# Patient Record
Sex: Female | Born: 1960 | Race: White | Hispanic: No | Marital: Married | Smoking: Former smoker
Health system: Southern US, Community
[De-identification: ages and names within clinical notes are randomized; demographics above are authoritative.]

## PROBLEM LIST (undated history)

## (undated) DIAGNOSIS — I10 Essential (primary) hypertension: Secondary | ICD-10-CM

## (undated) HISTORY — DX: Essential (primary) hypertension: I10

---

## 2004-02-06 ENCOUNTER — Emergency Department (HOSPITAL_COMMUNITY): Admission: EM | Admit: 2004-02-06 | Discharge: 2004-02-06 | Payer: Self-pay | Admitting: Emergency Medicine

## 2006-03-06 ENCOUNTER — Other Ambulatory Visit: Admission: RE | Admit: 2006-03-06 | Discharge: 2006-03-06 | Payer: Self-pay | Admitting: Gynecology

## 2006-03-13 ENCOUNTER — Encounter: Admission: RE | Admit: 2006-03-13 | Discharge: 2006-03-13 | Payer: Self-pay | Admitting: Gynecology

## 2006-12-20 ENCOUNTER — Inpatient Hospital Stay (HOSPITAL_COMMUNITY): Admission: AD | Admit: 2006-12-20 | Discharge: 2006-12-20 | Payer: Self-pay | Admitting: Obstetrics and Gynecology

## 2007-01-18 ENCOUNTER — Inpatient Hospital Stay (HOSPITAL_COMMUNITY): Admission: RE | Admit: 2007-01-18 | Discharge: 2007-01-21 | Payer: Self-pay | Admitting: Obstetrics and Gynecology

## 2007-01-18 ENCOUNTER — Encounter (INDEPENDENT_AMBULATORY_CARE_PROVIDER_SITE_OTHER): Payer: Self-pay | Admitting: Specialist

## 2007-04-10 ENCOUNTER — Encounter: Admission: RE | Admit: 2007-04-10 | Discharge: 2007-04-10 | Payer: Self-pay | Admitting: Obstetrics and Gynecology

## 2008-04-10 ENCOUNTER — Encounter: Admission: RE | Admit: 2008-04-10 | Discharge: 2008-04-10 | Payer: Self-pay | Admitting: Obstetrics and Gynecology

## 2011-03-18 NOTE — Op Note (Signed)
NAMEHIMANI, CORONA               ACCOUNT NO.:  [id number]   MEDICAL RECORD NO.:  [id number]          PATIENT TYPE:  INP   LOCATION:  NA                            FACILITY:  [facility]   PHYSICIAN:  Malva Limes, M.D.    DATE OF BIRTH:  1961-09-21   DATE OF PROCEDURE:  01/18/2007  DATE OF DISCHARGE:                               OPERATIVE REPORT   PREOPERATIVE DIAGNOSES:  1. Intrauterine pregnancy at term.  2. History of prior cesarean section.  3. The patient desires repeat cesarean section and bilateral tubal      ligation.   POSTOPERATIVE DIAGNOSES:  1. Intrauterine pregnancy at term.  2. History of prior cesarean section.  3. The patient desires repeat cesarean section and bilateral tubal      ligation.   PROCEDURE:  1. Repeat low transverse cesarean section.  2. Pomeroy bilateral tubal ligation.   SURGEON:  Dr. Dareen Piano   ASSISTANT:  Dr. Edward Jolly.   ANTIBIOTICS:  Ancef 1 g.   ANESTHESIA:  Spinal.   ESTIMATED BLOOD LOSS:  900 mL.   COMPLICATIONS:  None.   SPECIMENS:  None.   FINDINGS:  The patient delivered one viable white female infant.  Apgars 9  at one minute and 9 at five minutes.  The patient had normal placenta,  fallopian tubes, and ovaries bilaterally.   PROCEDURE:  The patient was taken to the operating room, where a spinal  anesthetic was administered without complications.  She was placed in  the dorsal supine position with a left lateral tilt.  The patient was  prepped with Betadine and draped in the usual fashion for this  procedure.  A Foley catheter was placed.  A Pfannenstiel incision was  made through the previous scar.  This was carried down to the fascia.  The fascia was entered in the midline and extended laterally with the  Mayo scissors.  The rectus muscles were then dissected from the fascia  with the Bovie.  Rectus muscles were divided in the midline and taken  superiorly and inferiorly.  The parietal peritoneum was opened sharply  and taken  superiorly and inferiorly.  The bladder flap was not taken  down.  A low transverse uterine incision was made in the midline and  extended laterally with blunt dissection.  Amniotic sac was entered.  The Fluid was noted to be clear.  The infant was delivered with the  vacuum extractor.  On delivery of the head, the oropharynx and nostrils  were bulb suctioned.  The remaining infant was then delivered.  There  was a nuchal cord x1.  The cord was doubly clamped and cut, and the  infant handed to the awaiting NICU team.  The placenta was manually  removed.  The uterus was exteriorized.  The uterine cavity was cleaned  with a wet lap and inspected.  The uterine incision was closed in a  single layer of 0 Monocryl suture in a running locking fashion.  The  right fallopian tube was then grasped in the isthmic portion with the  Babcock.  A 2 cm portion was doubly  ligated with 0 gut suture x2.  The  knuckle was excised.  Both ostia were visualized.  Hemostasis appeared  to be adequate.  Similar procedure was performed on the opposite side.  The uterus was placed back into the abdominal cavity.  Hemostasis again  checked and found to be adequate.  At this point, the parietal  peritoneum and rectus muscles were reapproximated in the midline using 2-  0 Monocryl in a running fashion.  The fascia was closed using 0 Monocryl  suture in a running fashion.  Subcuticular tissue was made hemostatic  with the Bovie.  Pfannenstiel clips were used to close the skin.  The  patient tolerated the procedure well.  She was taken to the recovery  room in stable condition.  Instrument and lap counts were correct x2.           ______________________________  Malva Limes, M.D.     MA/MEDQ  D:  01/18/2007  T:  01/18/2007  Job:  [id number]

## 2011-03-18 NOTE — Discharge Summary (Signed)
Amanda Reynolds, Amanda Reynolds               ACCOUNT NO.:  [id number]   MEDICAL RECORD NO.:  [id number]          PATIENT TYPE:  INP   LOCATION:  9142                          FACILITY:  [facility]   PHYSICIAN:  Randye Lobo, M.D.   DATE OF BIRTH:  03-14-1961   DATE OF ADMISSION:  01/18/2007  DATE OF DISCHARGE:  01/21/2007                               DISCHARGE SUMMARY   FINAL DIAGNOSES:  1. Intrauterine pregnancy at term.  2. History of prior cesarean section.  The patient desires repeat      cesarean section.  3. Patient desires permanent sterilization.  4. Mitral valve prolapse   PROCEDURE:  Repeat low transverse cesarean section and Pomeroy a  bilateral tubal ligation.   SURGEON:  Dr. Dareen Piano.   ASSISTANT:  Dr. Conley Simmonds.   COMPLICATIONS:  None.   This 50 year old, G2 P 1-0-0-1, presents at term for repeat cesarean  section.  The patient's antepartum course up to this point had been  complicated by advanced maternal age.  The patient was 45 at the time of  conception.  The patient had a first-trimester screen performed which  came back normal.  She declined AFP and amniocentesis.  Otherwise the  patient's antepartum course had been uncomplicated.  She had a marginal  placenta previa that resolved by 28 weeks.  She is admitted on January 18, 2007 where she is taken to the operating room by Dr. Malva Limes.  A  repeat low transverse cesarean section was performed with delivery of an  8 pound 4 ounces female infant with Apgars of 9 and 9.  At this point the  patient still expressed her desires for permanent sterilization.  This  was performed using the Pomeroy method.  The procedure went without  complications.   The patient's postoperative course was benign without any significant  fevers.  She did have some mild postoperative anemia, and she was  started on iron during her hospital stay.  She did have her little boy  circumcised before discharge.  She was felt ready for discharge  on  postoperative day #3.  Was sent home on a regular diet, told to decrease  activities, told to continue her prenatal vitamins and her iron  supplement.  She was given Percocet one to two every 4-6 hours as needed  for pain, ibuprofen up to 600 mg every 6 hours as needed for pain, and  is to follow up  in our office in 4 weeks.  The patient was told to continue a bland diet  until her bowel function returned to normal.  She is of course to call  with any increased bleeding, pain or problems.   LABS ON DISCHARGE:  The patient had a hemoglobin of 9.6, white blood  cell count of 8.9, platelets of 155,000.      Amanda Reynolds, Amanda Reynolds.      Randye Lobo, M.D.  Electronically Signed    MB/MEDQ  D:  02/21/2007  T:  02/21/2007  Job:  [id number]

## 2011-11-15 ENCOUNTER — Encounter (HOSPITAL_COMMUNITY): Payer: Self-pay

## 2011-11-15 ENCOUNTER — Observation Stay (HOSPITAL_COMMUNITY)
Admission: EM | Admit: 2011-11-15 | Discharge: 2011-11-16 | Disposition: A | Discharge status: Home / Self Care | Payer: 59 | Attending: Emergency Medicine | Admitting: Emergency Medicine

## 2011-11-15 ENCOUNTER — Emergency Department (HOSPITAL_COMMUNITY): Payer: 59

## 2011-11-15 ENCOUNTER — Other Ambulatory Visit: Payer: Self-pay

## 2011-11-15 DIAGNOSIS — R05 Cough: Secondary | ICD-10-CM | POA: Insufficient documentation

## 2011-11-15 DIAGNOSIS — R110 Nausea: Secondary | ICD-10-CM | POA: Insufficient documentation

## 2011-11-15 DIAGNOSIS — R059 Cough, unspecified: Secondary | ICD-10-CM | POA: Insufficient documentation

## 2011-11-15 DIAGNOSIS — R079 Chest pain, unspecified: Principal | ICD-10-CM | POA: Insufficient documentation

## 2011-11-15 LAB — CBC
HCT: 38.2 % (ref 36.0–46.0)
Hemoglobin: 12.8 g/dL (ref 12.0–15.0)
MCH: 26.1 pg (ref 26.0–34.0)
MCHC: 33.5 g/dL (ref 30.0–36.0)
MCV: 77.8 fL — ABNORMAL LOW (ref 78.0–100.0)
Platelets: 306 10*3/uL (ref 150–400)
RBC: 4.91 MIL/uL (ref 3.87–5.11)
RDW: 14.3 % (ref 11.5–15.5)
WBC: 11.9 10*3/uL — ABNORMAL HIGH (ref 4.0–10.5)

## 2011-11-15 LAB — CARDIAC PANEL(CRET KIN+CKTOT+MB+TROPI)
CK, MB: 1.7 ng/mL (ref 0.3–4.0)
Relative Index: INVALID (ref 0.0–2.5)
Total CK: 73 U/L (ref 7–177)
Troponin I: <0.3 ng/mL (ref <0.30)

## 2011-11-15 LAB — COMPREHENSIVE METABOLIC PANEL
ALT: 11 U/L (ref 0–35)
AST: 12 U/L (ref 0–37)
Albumin: 3.4 g/dL — ABNORMAL LOW (ref 3.5–5.2)
Alkaline Phosphatase: 84 U/L (ref 39–117)
BUN: 10 mg/dL (ref 6–23)
CO2: 22 mEq/L (ref 19–32)
Calcium: 9.3 mg/dL (ref 8.4–10.5)
Chloride: 103 mEq/L (ref 96–112)
Creatinine, Ser: 0.56 mg/dL (ref 0.50–1.10)
GFR calc Af Amer: >90 mL/min (ref >90)
GFR calc non Af Amer: >90 mL/min (ref >90)
Glucose, Bld: 93 mg/dL (ref 70–99)
Potassium: 3.7 mEq/L (ref 3.5–5.1)
Sodium: 138 mEq/L (ref 135–145)
Total Bilirubin: 0.2 mg/dL — ABNORMAL LOW (ref 0.3–1.2)
Total Protein: 7.5 g/dL (ref 6.0–8.3)

## 2011-11-15 LAB — DIFFERENTIAL
Basophils Absolute: 0 10*3/uL (ref 0.0–0.1)
Basophils Relative: 0 % (ref 0–1)
Eosinophils Absolute: 0.1 10*3/uL (ref 0.0–0.7)
Eosinophils Relative: 1 % (ref 0–5)
Lymphocytes Relative: 25 % (ref 12–46)
Lymphs Abs: 3 10*3/uL (ref 0.7–4.0)
Monocytes Absolute: 0.8 10*3/uL (ref 0.1–1.0)
Monocytes Relative: 6 % (ref 3–12)
Neutro Abs: 8.1 10*3/uL — ABNORMAL HIGH (ref 1.7–7.7)
Neutrophils Relative %: 68 % (ref 43–77)

## 2011-11-15 LAB — TROPONIN I
Troponin I: <0.3 ng/mL (ref <0.30)
Troponin I: <0.3 ng/mL (ref <0.30)

## 2011-11-15 MED ORDER — MORPHINE SULFATE 4 MG/ML IJ SOLN
4.0000 mg | Freq: Once | INTRAMUSCULAR | Status: AC
Start: 2011-11-15 — End: 2011-11-15
  Administered 2011-11-15: 4 mg via INTRAVENOUS
  Filled 2011-11-15: qty 1

## 2011-11-15 MED ORDER — ONDANSETRON HCL 4 MG/2ML IJ SOLN
4.0000 mg | Freq: Once | INTRAMUSCULAR | Status: AC
  Administered 2011-11-15: 4 mg via INTRAVENOUS
  Filled 2011-11-15: qty 2

## 2011-11-15 MED ORDER — NITROGLYCERIN 2 % TD OINT: 1.0000 [in_us] | TOPICAL_OINTMENT | Freq: Once | TRANSDERMAL | Status: DC

## 2011-11-15 MED ORDER — ONDANSETRON HCL 4 MG/2ML IJ SOLN
4.0000 mg | Freq: Once | INTRAMUSCULAR | Status: AC
Start: 2011-11-15 — End: 2011-11-15
  Administered 2011-11-15: 4 mg via INTRAVENOUS
  Filled 2011-11-15: qty 2

## 2011-11-15 MED ORDER — NITROGLYCERIN 2 % TD OINT
1.0000 [in_us] | TOPICAL_OINTMENT | TRANSDERMAL | Status: DC
  Filled 2011-11-15: qty 30

## 2011-11-15 NOTE — ED Notes (Signed)
Patient transported from X-ray 

## 2011-11-15 NOTE — ED Notes (Addendum)
PATIENT HAS ARRIVED IN CDU. SHE IS ALERT AND ORIENTED. SHE DENIES CP AT THIS TIME. PATIENT PLACED ON MONITOR. SINUS RHYTHM

## 2011-11-15 NOTE — ED Provider Notes (Signed)
History     CSN: [id number]  Arrival date & time 11/15/11  1538   First MD Initiated Contact with Patient 11/15/11 1546      Chief Complaint  Patient presents with  . Chest Pain    (Consider location/radiation/quality/duration/timing/severity/associated sxs/prior treatment) HPI  51yoF is a healthy presents with chest pain. The patient states that approximately 2 hours prior to arrival she began to feel sharp precordial chest pain with radiation to her left arm. She states that her left arm aches. She denies numbness, tingling, weakness of the same. She states that she has nausea but the nausea has been present for approximately one week. Is no radiation of the pain to her back. She has no shortness of breath. She's had cough for approximately one week. Denies fevers, chills. She states that she's had similar pain in the past thought to be related to anxiety, stress. She's been seen by cardiology approximately one year ago. She's had no stress test or catheterization recently. She states that at maximum the pain was a 10 out of 10 it is currently 2/10 after aspirin and one nitroglycerin given by EMS. He has a history of hypertension, no hyperlipidemia. She is a former smoker. Denies h/o VTE in self or family. No recent hosp/surg/immob. No h/o cancer. Denies exogenous hormone use, no leg pain or swelling   ED Notes, ED Provider Notes from 11/15/11 0000 to 11/15/11 15:22:53       Raynelle Fanning E. Chana Bode, RN 11/15/2011 15:21      Per ems- pt c/o central chest pain and left arm pain (no radiation). Pt has had similar pain intermittently for over 1 year and pt states that she believes it is stress related. Pt also c/o left arm pain from shoulder to arm. Pt states she has been nauseous but has been going on x1wk. 20 left hand. 324 asa. 1 nitro. Pain currently 2/10.     History reviewed. No pertinent past medical history.  History reviewed. No pertinent past surgical history.  Family History  Problem  Relation Age of Onset  . Diabetes Mother   . Hypertension Mother    artery disease in her father with ami age 82  History  Substance Use Topics  . Smoking status: Never Smoker   . Smokeless tobacco: Not on file  . Alcohol Use: Yes     occ    OB History    Grav Para Term Preterm Abortions TAB SAB Ect Mult Living                  Review of Systems  All other systems reviewed and are negative.   except as noted HPI   Allergies  Review of patient's allergies indicates no known allergies.  Home Medications   Current Outpatient Rx  Name Route Sig Dispense Refill  . CALCIUM + D PO Oral Take 1 tablet by mouth 2 (two) times daily.    Marland Kitchen VITAMIN D2 2000 UNITS PO TABS Oral Take 1 tablet by mouth daily.    . OMEGA-3 FATTY ACIDS 1000 MG PO CAPS Oral Take 2 g by mouth daily.    . IBUPROFEN 200 MG PO TABS Oral Take 400-600 mg by mouth every 6 (six) hours as needed. For pain    . ADULT MULTIVITAMIN W/MINERALS CH Oral Take 1 tablet by mouth daily.    . SODIUM CHLORIDE 0.65 % NA SOLN Nasal Place 1 spray into the nose as needed. For nasal congestion  BP 129/62  Pulse 80  Temp(Src) 98.6 F (37 C) (Oral)  Resp 15  SpO2 100%  Physical Exam  Nursing note and vitals reviewed. Constitutional: She is oriented to person, place, and time. She appears well-developed.  HENT:  Head: Atraumatic.  Mouth/Throat: Oropharynx is clear and moist.  Eyes: Conjunctivae and EOM are normal. Pupils are equal, round, and reactive to light.  Neck: Normal range of motion. Neck supple.  Cardiovascular: Normal rate, regular rhythm, normal heart sounds and intact distal pulses.   Pulmonary/Chest: Effort normal and breath sounds normal. No respiratory distress. She has no wheezes. She has no rales. She exhibits no tenderness.  Abdominal: Soft. She exhibits no distension. There is no tenderness. There is no rebound and no guarding.  Musculoskeletal: Normal range of motion.  Neurological: She is alert and  oriented to person, place, and time.  Skin: Skin is warm and dry. No rash noted.  Psychiatric: She has a normal mood and affect.    Date: 11/15/2011  Rate: 75  Rhythm: normal sinus rhythm  QRS Axis: normal  Intervals: normal  ST/T Wave abnormalities: normal  Conduction Disutrbances:none  Narrative Interpretation:   Old EKG Reviewed: none available    ED Course  Procedures (including critical care time)  Labs Reviewed  CBC - Abnormal; Notable for the following:    WBC 11.9 (*)    MCV 77.8 (*)    All other components within normal limits  DIFFERENTIAL - Abnormal; Notable for the following:    Neutro Abs 8.1 (*)    All other components within normal limits  COMPREHENSIVE METABOLIC PANEL - Abnormal; Notable for the following:    Albumin 3.4 (*)    Total Bilirubin 0.2 (*)    All other components within normal limits  CARDIAC PANEL(CRET KIN+CKTOT+MB+TROPI)  TROPONIN I   Dg Chest 2 View  11/15/2011  *RADIOLOGY REPORT*  Clinical Data: Sharp mid chest pain, left arm pain, some nausea  CHEST - 2 VIEW  Comparison: Chest x-ray of 12/29/2010  Findings: The lungs are clear.  Mediastinal contours appear normal. The heart is within normal limits in size.  There are degenerative changes throughout the thoracic spine.  IMPRESSION: No active lung disease.  Original Report Authenticated By: Juline Patch, M.D.     1. Chest pain       MDM  Atypical chest pain. TIMI 0. PERC 0. Will check cardiac enzymes. Will need at least two sets. EKG unremarkable. XR, morphine, nitroglycerin. ASA given pta.  Reassess.  1832 Pt without cp at this time. Will place in CDU under cp protocol for repeat cardiac enzymes and stress test in the morning.           Forbes Cellar, MD 11/15/11 [phone]

## 2011-11-15 NOTE — ED Notes (Signed)
Pt aware that she will be going to CDU.  Pt remains CP free at this time.  Pt denies any needs at this time.

## 2011-11-15 NOTE — ED Notes (Signed)
Pt resting in stretcher with family at bedside.  Pt denies needs at this time.

## 2011-11-15 NOTE — ED Notes (Signed)
Per ems- pt c/o central chest pain and left arm pain (no radiation). Pt has had similar pain intermittently for over 1 year and pt states that she believes it is stress related. Pt also c/o left arm pain from shoulder to arm. Pt states she has been nauseous but has been going on x1wk. 20 left hand. 324 asa. 1 nitro. Pain currently 2/10.

## 2011-11-15 NOTE — ED Notes (Signed)
Requested nitro paste from pharmacy

## 2011-11-16 ENCOUNTER — Encounter (HOSPITAL_COMMUNITY): Payer: Self-pay | Admitting: *Deleted

## 2011-11-16 ENCOUNTER — Other Ambulatory Visit: Payer: Self-pay

## 2011-11-16 DIAGNOSIS — R072 Precordial pain: Secondary | ICD-10-CM

## 2011-11-16 NOTE — ED Provider Notes (Signed)
Medical screening examination/treatment/procedure(s) were performed by non-physician practitioner and as supervising physician I was immediately available for consultation/collaboration.   Leigh-Ann Neiko Trivedi, MD 11/16/11 2230 

## 2011-11-16 NOTE — ED Provider Notes (Signed)
Patient in CDU under chest pain protocol.  Patient scheduled for stress echo in AM.  Patient currently chest pain free, but does complain of nausea.  Lungs CTA bilaterally.  S1/S2, RRR, no murmur.  Abdomen soft, bowel sounds present.  Strong distal pulses palpated all extremities.  Monitor reveals sinus rhythm without ectopy.  12 lead reviewed, no indication of ischemia.  Cardiac markers negative.  Patient scheduled for stress echo in AM.  Jimmye Norman, NP 11/16/11 [phone]

## 2011-11-16 NOTE — ED Provider Notes (Signed)
8:21 AM Patient is in CDU under observation, chest pain protocol.  Patient denies any chest pain at this time, reports nausea has also resolved.  Reports no problems overnight.  On exam, pt is A&Ox4, NAD, RRR, no m/r/g, CTAB, abd soft, NT, extremities without edema, distal pulses intact and equal bilaterally.  Plan is for exercise stress test this morning.    11:40 AM Received a call from Dr Eden Emms.  Stress echo is normal.  Patient continues to feel well.  PCP is [facility] on [address], which is closing down this month.  Patient has health insurance and would like to use her friends' recommendations to find a primary care provider.  I have advised close follow up.  Plan is for d/c home.    Amanda Reynolds, [place] 11/16/11 1147

## 2011-11-16 NOTE — ED Notes (Signed)
Pt returned from echocardiogram. Pt denies any pain, sob or discomfort. Pt assisted back to bed and placed back on cardiac monitor.

## 2011-11-16 NOTE — ED Provider Notes (Signed)
Medical screening examination/treatment/procedure(s) were performed by non-physician practitioner and as supervising physician I was immediately available for consultation/collaboration.   Forbes Cellar, MD 11/16/11 2230

## 2011-11-16 NOTE — Progress Notes (Signed)
Observation review is complete. 

## 2011-11-16 NOTE — Progress Notes (Signed)
  Echocardiogram Echocardiogram Stress Test has been performed.  Ramzey Petrovic, Real Cons 11/16/2011, 10:52 AM

## 2011-12-30 ENCOUNTER — Encounter: Payer: Self-pay | Admitting: Family Medicine

## 2011-12-30 ENCOUNTER — Ambulatory Visit (INDEPENDENT_AMBULATORY_CARE_PROVIDER_SITE_OTHER): Payer: 59 | Admitting: Family Medicine

## 2011-12-30 VITALS — BP 132/79 | HR 77 | Temp 97.4°F | Resp 20 | Ht 59.5 in | Wt 185.2 lb

## 2011-12-30 DIAGNOSIS — Z23 Encounter for immunization: Secondary | ICD-10-CM

## 2011-12-30 DIAGNOSIS — Z0000 Encounter for general adult medical examination without abnormal findings: Secondary | ICD-10-CM

## 2011-12-30 NOTE — Progress Notes (Signed)
  Subjective:    Patient ID: Amanda Reynolds, female    DOB: Jun 30, 1961, 51 y.o.   MRN: [id number]  HPI Amanda Reynolds is a 51 y.o. female  Working on weight loss - diet and walking more.   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 Fasting today.   Taking flax seed. Hx of elevated cholesterol in family. Review of Systems  13 point ROS reviewed on PHS.  - see pink sheet scanned.   Admitted to [facility] in January - had chest pain in meeting, but notes chest, had normal enzymes, EKG, and normal stress test in hospital.  Has seen cards in past.   Father deceased at 5yo - possible MI.     Objective:   Physical Exam  Constitutional: She is oriented to person, place, and time. She appears well-developed and well-nourished. No distress.  HENT:  Head: Normocephalic and atraumatic.  Right Ear: External ear normal.  Left Ear: External ear normal.  Eyes: Conjunctivae and EOM are normal. Pupils are equal, round, and reactive to light.  Neck: Normal range of motion. Neck supple.  Cardiovascular: Normal rate, regular rhythm, normal heart sounds and intact distal pulses.   No murmur heard. Pulmonary/Chest: Effort normal and breath sounds normal.  Abdominal: Soft. Bowel sounds are normal.  Musculoskeletal: Normal range of motion. She exhibits no edema and no tenderness.  Lymphadenopathy:    She has no cervical adenopathy.  Neurological: She is alert and oriented to person, place, and time.  Skin: Skin is warm and dry.  Psychiatric: She has a normal mood and affect. Her behavior is normal.          Assessment & Plan:  Amanda Reynolds is a 51 y.o. female  CPE - anticipatory guidance given.  Check CMP, lipids, CBC.  Tdap given. Plans on mammogram and pap smear at GYN.  Chest pain - s/p workup at [facility].Possible FH of CAD, but recent workup ok.  Consider follow up with cards in next 1 year, sooner or to ER if recurrence of symptoms - pt to call Dr. Katrinka Blazing to determine follow up.

## 2011-12-31 LAB — COMPREHENSIVE METABOLIC PANEL
ALT: 13 U/L (ref 0–35)
AST: 17 U/L (ref 0–37)
Albumin: 4.5 g/dL (ref 3.5–5.2)
Alkaline Phosphatase: 86 U/L (ref 39–117)
BUN: 10 mg/dL (ref 6–23)
CO2: 17 mEq/L — ABNORMAL LOW (ref 19–32)
Calcium: 9.3 mg/dL (ref 8.4–10.5)
Chloride: 104 mEq/L (ref 96–112)
Creat: 0.57 mg/dL (ref 0.50–1.10)
Glucose, Bld: 90 mg/dL (ref 70–99)
Potassium: 3.9 mEq/L (ref 3.5–5.3)
Sodium: 137 mEq/L (ref 135–145)
Total Bilirubin: 0.3 mg/dL (ref 0.3–1.2)
Total Protein: 7.2 g/dL (ref 6.0–8.3)

## 2011-12-31 LAB — CBC

## 2011-12-31 LAB — LIPID PANEL
Cholesterol: 207 mg/dL — ABNORMAL HIGH (ref 0–200)
HDL: 43 mg/dL (ref >39)
LDL Cholesterol: 144 mg/dL — ABNORMAL HIGH (ref 0–99)
Total CHOL/HDL Ratio: 4.8 Ratio
Triglycerides: 99 mg/dL (ref <150)
VLDL: 20 mg/dL (ref 0–40)

## 2012-01-11 ENCOUNTER — Telehealth: Payer: Self-pay

## 2012-01-11 NOTE — Telephone Encounter (Signed)
Pt is calling for her sugar and cholesterol numbers  Please call her at work

## 2012-01-11 NOTE — Telephone Encounter (Signed)
Spoke with patient gave patient results of labs

## 2012-04-30 ENCOUNTER — Other Ambulatory Visit: Payer: Self-pay | Admitting: Obstetrics and Gynecology

## 2013-06-14 ENCOUNTER — Other Ambulatory Visit: Payer: Self-pay | Admitting: Gastroenterology

## 2018-05-08 ENCOUNTER — Emergency Department (HOSPITAL_COMMUNITY): Payer: Self-pay

## 2018-05-08 ENCOUNTER — Encounter (HOSPITAL_COMMUNITY): Payer: Self-pay | Admitting: Internal Medicine

## 2018-05-08 ENCOUNTER — Emergency Department (HOSPITAL_COMMUNITY)
Admission: EM | Admit: 2018-05-08 | Discharge: 2018-05-08 | Disposition: A | Discharge status: Home / Self Care | Payer: Self-pay | Attending: Emergency Medicine | Admitting: Emergency Medicine

## 2018-05-08 DIAGNOSIS — H9201 Otalgia, right ear: Secondary | ICD-10-CM | POA: Insufficient documentation

## 2018-05-08 DIAGNOSIS — R079 Chest pain, unspecified: Secondary | ICD-10-CM | POA: Insufficient documentation

## 2018-05-08 DIAGNOSIS — R197 Diarrhea, unspecified: Secondary | ICD-10-CM | POA: Insufficient documentation

## 2018-05-08 DIAGNOSIS — R1030 Lower abdominal pain, unspecified: Secondary | ICD-10-CM | POA: Insufficient documentation

## 2018-05-08 DIAGNOSIS — I10 Essential (primary) hypertension: Secondary | ICD-10-CM | POA: Insufficient documentation

## 2018-05-08 DIAGNOSIS — R42 Dizziness and giddiness: Secondary | ICD-10-CM | POA: Insufficient documentation

## 2018-05-08 DIAGNOSIS — R5383 Other fatigue: Secondary | ICD-10-CM | POA: Insufficient documentation

## 2018-05-08 DIAGNOSIS — Z87891 Personal history of nicotine dependence: Secondary | ICD-10-CM | POA: Insufficient documentation

## 2018-05-08 DIAGNOSIS — R110 Nausea: Secondary | ICD-10-CM | POA: Insufficient documentation

## 2018-05-08 LAB — HEPATIC FUNCTION PANEL
ALT: 15 U/L (ref 0–44)
AST: 19 U/L (ref 15–41)
Albumin: 3.4 g/dL — ABNORMAL LOW (ref 3.5–5.0)
Alkaline Phosphatase: 79 U/L (ref 38–126)
Bilirubin, Direct: 0.2 mg/dL (ref 0.0–0.2)
Indirect Bilirubin: 0.3 mg/dL (ref 0.3–0.9)
Total Bilirubin: 0.5 mg/dL (ref 0.3–1.2)
Total Protein: 7.1 g/dL (ref 6.5–8.1)

## 2018-05-08 LAB — URINALYSIS, ROUTINE W REFLEX MICROSCOPIC
Bilirubin Urine: NEGATIVE
Glucose, UA: NEGATIVE mg/dL
Ketones, ur: NEGATIVE mg/dL
Leukocytes, UA: NEGATIVE
Nitrite: NEGATIVE
Protein, ur: NEGATIVE mg/dL
Specific Gravity, Urine: 1.015 (ref 1.005–1.030)
pH: 7 (ref 5.0–8.0)

## 2018-05-08 LAB — BASIC METABOLIC PANEL
Anion gap: 11 (ref 5–15)
BUN: 9 mg/dL (ref 6–20)
CO2: 23 mmol/L (ref 22–32)
Calcium: 9.1 mg/dL (ref 8.9–10.3)
Chloride: 105 mmol/L (ref 98–111)
Creatinine, Ser: 0.79 mg/dL (ref 0.44–1.00)
GFR calc Af Amer: >60 mL/min (ref >60)
GFR calc non Af Amer: >60 mL/min (ref >60)
Glucose, Bld: 115 mg/dL — ABNORMAL HIGH (ref 70–99)
Potassium: 4.5 mmol/L (ref 3.5–5.1)
Sodium: 139 mmol/L (ref 135–145)

## 2018-05-08 LAB — CBC
HCT: 41.1 % (ref 36.0–46.0)
Hemoglobin: 12.9 g/dL (ref 12.0–15.0)
MCH: 25.6 pg — ABNORMAL LOW (ref 26.0–34.0)
MCHC: 31.4 g/dL (ref 30.0–36.0)
MCV: 81.7 fL (ref 78.0–100.0)
Platelets: 346 10*3/uL (ref 150–400)
RBC: 5.03 MIL/uL (ref 3.87–5.11)
RDW: 15.1 % (ref 11.5–15.5)
WBC: 11.9 10*3/uL — ABNORMAL HIGH (ref 4.0–10.5)

## 2018-05-08 LAB — I-STAT TROPONIN, ED
Troponin i, poc: 0 ng/mL (ref 0.00–0.08)
Troponin i, poc: 0 ng/mL (ref 0.00–0.08)

## 2018-05-08 LAB — LIPASE, BLOOD: Lipase: 35 U/L (ref 11–51)

## 2018-05-08 LAB — CBG MONITORING, ED: Glucose-Capillary: 94 mg/dL (ref 70–99)

## 2018-05-08 LAB — I-STAT BETA HCG BLOOD, ED (MC, WL, AP ONLY): I-stat hCG, quantitative: <5 m[IU]/mL (ref <5)

## 2018-05-08 MED ORDER — MECLIZINE HCL 25 MG PO TABS
25.0000 mg | ORAL_TABLET | Freq: Once | ORAL | Status: AC
  Administered 2018-05-08: 25 mg via ORAL
  Filled 2018-05-08: qty 1

## 2018-05-08 MED ORDER — ONDANSETRON HCL 4 MG/2ML IJ SOLN
4.0000 mg | Freq: Once | INTRAMUSCULAR | Status: AC
  Administered 2018-05-08: 4 mg via INTRAVENOUS
  Filled 2018-05-08: qty 2

## 2018-05-08 MED ORDER — SODIUM CHLORIDE 0.9 % IV BOLUS
1000.0000 mL | Freq: Once | INTRAVENOUS | Status: AC
  Administered 2018-05-08: 1000 mL via INTRAVENOUS

## 2018-05-08 MED ORDER — MECLIZINE HCL 25 MG PO TABS: 25.0000 mg | ORAL_TABLET | Freq: Three times a day (TID) | ORAL | 0 refills | Status: DC | PRN

## 2018-05-08 NOTE — Discharge Instructions (Signed)
See your family doctor within 2 to 3 days if still having mild symptoms but come back to the ER for worsening symptoms.  Your MRI was normal

## 2018-05-08 NOTE — ED Notes (Signed)
ED Provider at bedside. 

## 2018-05-08 NOTE — ED Provider Notes (Signed)
[facility]ENC[facility] Provider Note   CSN: [id number] Arrival date & time: 05/08/18  1147     History   Chief Complaint Chief Complaint  Patient presents with  . Chest Pain  . Nausea  . Dizziness    HPI Amanda Reynolds is a 57 y.o. female.  HPI  57 year old female presents with multiple complaints.  Chief complaint is dizziness and chest pain.  For the past 1 week or so she is been having some diarrhea with about 2 loose stools a day without blood.  She is also been having urinary urgency and frequency but only mild urine output and dark urine.  Has been having some on and off lower abdominal cramping that feels like menstrual cramps except she has not had a period in about 1 year.  She is also had some right ear pain and generalized fatigue.  She is been having on and off blurry vision that only lasts a couple minutes and is in both eyes.  Currently her vision is normal.  Today she started developing dizziness and feels like a room spinning sensation.  It is essentially constant.  She vomited when she turned quickly and has been feeling nauseated.  She had a brief episode of left-sided heavy chest pain that lasted a few minutes but has resolved on its own.  No shortness of breath.  Past Medical History:  Diagnosis Date  . 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    There are no active problems to display for this patient.   Past Surgical History:  Procedure Laterality Date  . CESAREAN SECTION       OB History   None      Home Medications    Prior to Admission medications   Not on File    Family History Family History  Problem Relation Age of Onset  . Diabetes Mother   . Hypertension Mother     Social History Social History   Tobacco Use  . Smoking status: Former Smoker    Types: Cigarettes    Last attempt to quit: 07/01/2006    Years since quitting: 11.8  Substance Use Topics  . Alcohol use: Yes    Alcohol/week: 0.6 oz   Types: 1 Standard drinks or equivalent per week    Comment: occ  . Drug use: No     Allergies   Codeine   Review of Systems Review of Systems  Constitutional: Positive for fatigue. Negative for diaphoresis and fever.  Eyes: Positive for visual disturbance.  Respiratory: Negative for shortness of breath.   Cardiovascular: Positive for chest pain.  Gastrointestinal: Positive for abdominal pain, diarrhea, nausea and vomiting. Negative for blood in stool.  Genitourinary: Positive for decreased urine volume and urgency. Negative for dysuria.  Neurological: Positive for dizziness and headaches. Negative for weakness and numbness.  All other systems reviewed and are negative.    Physical Exam Updated Vital Signs BP 132/76   Pulse 90   Temp 98.8 F (37.1 C) (Oral)   Resp 14   Ht 4\' 11"  (1.499 m)   Wt 78 kg (172 lb)   SpO2 100%   BMI 34.74 kg/m   Physical Exam  Constitutional: She is oriented to person, place, and time. She appears well-developed and well-nourished.  Non-toxic appearance. She does not appear ill. No distress.  HENT:  Head: Normocephalic and atraumatic.  Right Ear: Tympanic membrane and external ear normal.  Left Ear: Tympanic membrane and external ear normal.  Nose: Nose normal.  Eyes: Pupils are equal, round, and reactive to light. Right eye exhibits no discharge. Left eye exhibits no discharge. Right eye exhibits nystagmus. Left eye exhibits nystagmus.  Cardiovascular: Normal rate, regular rhythm and normal heart sounds.  Pulmonary/Chest: Effort normal and breath sounds normal.  Abdominal: Soft. There is no tenderness.  Neurological: She is alert and oriented to person, place, and time.  CN 3-12 grossly intact. 5/5 strength in all 4 extremities. Grossly normal sensation. Normal finger to nose.   Skin: Skin is warm and dry.  Nursing note and vitals reviewed.    ED Treatments / Results  Labs (all labs ordered are listed, but only abnormal results are  displayed) Labs Reviewed  BASIC METABOLIC PANEL - Abnormal; Notable for the following components:      Result Value   Glucose, Bld 115 (*)    All other components within normal limits  CBC - Abnormal; Notable for the following components:   WBC 11.9 (*)    MCH 25.6 (*)    All other components within normal limits  URINALYSIS, ROUTINE W REFLEX MICROSCOPIC - Abnormal; Notable for the following components:   APPearance HAZY (*)    Hgb urine dipstick MODERATE (*)    Bacteria, UA MANY (*)    All other components within normal limits  HEPATIC FUNCTION PANEL - Abnormal; Notable for the following components:   Albumin 3.4 (*)    All other components within normal limits  URINE CULTURE  LIPASE, BLOOD  I-STAT TROPONIN, ED  I-STAT BETA HCG BLOOD, ED ([facility], [facility], [facility] ONLY)  CBG MONITORING, ED  I-STAT TROPONIN, ED    EKG EKG Interpretation  Date/Time:  Tuesday May 08 2018 11:51:05 EDT Ventricular Rate:  94 PR Interval:    QRS Duration: 78 QT Interval:  352 QTC Calculation: 441 R Axis:   38 Text Interpretation:  Sinus rhythm Low voltage, precordial leads Consider anterior infarct Confirmed by Pricilla Loveless [phone]) on 05/08/2018 12:23:46 PM   Radiology Dg Chest 2 View  Result Date: 05/08/2018 CLINICAL DATA:  Chest pressure for 1 day. Diarrhea for 1 week with nausea and vomiting over the last day. EXAM: CHEST - 2 VIEW COMPARISON:  Radiographs 11/15/2011 and 12/29/2010. FINDINGS: The heart size and mediastinal contours are normal. The lungs are clear. There is no pleural effusion or pneumothorax. No acute osseous findings are identified. There are stable degenerative changes throughout the spine. Telemetry leads overlie the chest. IMPRESSION: Stable chest.  No active cardiopulmonary process. Electronically Signed   By: Carey Bullocks M.D.   On: 05/08/2018 12:52    Procedures Procedures (including critical care time)  Medications Ordered in ED Medications  sodium chloride 0.9 % bolus  1,000 mL (0 mLs Intravenous Stopped 05/08/18 1403)  ondansetron (ZOFRAN) injection 4 mg (4 mg Intravenous Given 05/08/18 1308)  meclizine (ANTIVERT) tablet 25 mg (25 mg Oral Given 05/08/18 1511)     Initial Impression / Assessment and Plan / ED Course  I have reviewed the triage vital signs and the nursing notes.  Pertinent labs & imaging results that were available during my care of the patient were reviewed by me and considered in my medical decision making (see chart for details).     Patient's vague complaints of diarrhea and fatigue as well as some mild cough are probably related to a viral illness.  However now she is displaying symptoms of vertigo.  Mild nystagmus.  MRI will be obtained as she has describes some intermittent blurry  vision to rule out cerebellar infarct or mass.  She is feeling better with Antivert and Zofran.  Her chest pain was very atypical and I doubt ACS or PE.  Doubt dissection.  Second troponin pending an MRI pending when care transferred to Dr. Hyacinth Meeker.  Final Clinical Impressions(s) / ED Diagnoses   Final diagnoses:  None    ED Discharge Orders    None       Pricilla Loveless, MD 05/08/18 1739

## 2018-05-08 NOTE — ED Provider Notes (Signed)
The patient has a negative MRI, she was informed of these results, stable for discharge   Amanda Reynolds, Amanda Basista, MD 05/08/18 [id number]

## 2018-05-08 NOTE — ED Triage Notes (Signed)
Pt here from home c/o chest pressure, nausea/vomiting/diarrhea, diaphoresis. Pt reports diarrhea for a week. Nausea began in the last 24 hours. Vomiting began this am around 0530. Chest pain began last night while watching TV. Describes it as left sided pressure non radiating.  Pt experiened CP this morning. Denies currently. Pt given 4mg  zofran by EMS. Pt also reports frequent urination x1 week with "very little and very dark" output. Vital signs stable at this time. Call bell within reach.

## 2018-05-08 NOTE — ED Notes (Signed)
Pt denies chest pain at this time.

## 2018-05-10 LAB — URINE CULTURE: Culture: >=100000 — AB

## 2018-05-11 ENCOUNTER — Telehealth: Payer: Self-pay | Admitting: *Deleted

## 2018-05-11 NOTE — Progress Notes (Signed)
ED Antimicrobial Stewardship Positive Culture Follow Up   Amanda Reynolds is an 57 y.o. female who presented to [facility] with a chief complaint of  Chief Complaint  Patient presents with  . Chest Pain  . Nausea  . Dizziness    Recent Results (from the past 720 hour(s))  Urine culture     Status: Abnormal   Collection Time: 05/08/18  3:00 PM  Result Value Ref Range Status   Specimen Description URINE, RANDOM  Final   Special Requests   Final    NONE Performed at [facility], [address] [place], [place] [address]    Culture >=100,000 COLONIES/mL ESCHERICHIA COLI (A)  Final   Report Status 05/10/2018 FINAL  Final   Organism ID, Bacteria ESCHERICHIA COLI (A)  Final      Susceptibility   Escherichia coli - MIC*    AMPICILLIN >=32 RESISTANT Resistant     CEFAZOLIN <=4 SENSITIVE Sensitive     CEFTRIAXONE <=1 SENSITIVE Sensitive     CIPROFLOXACIN 1 SENSITIVE Sensitive     GENTAMICIN <=1 SENSITIVE Sensitive     IMIPENEM <=0.25 SENSITIVE Sensitive     NITROFURANTOIN <=16 SENSITIVE Sensitive     TRIMETH/SULFA >=320 RESISTANT Resistant     AMPICILLIN/SULBACTAM 16 INTERMEDIATE Intermediate     PIP/TAZO <=4 SENSITIVE Sensitive     Extended ESBL NEGATIVE Sensitive     * >=100,000 COLONIES/mL ESCHERICHIA COLI    []  Treated with N/A, organism resistant to prescribed antimicrobial [x]  Patient discharged originally without antimicrobial agent and treatment is now indicated  New antibiotic prescription: If pt with continued symptoms, start cephalexin 500mg  PO BID x 7 days  ED Provider: Sharen Hecklaudia Gibbons, PA   Amanda Reynolds, Amanda Reynolds 05/11/2018, 9:44 AM Clinical Pharmacist Monday - Friday phone -  [phone] Saturday - Sunday phone - [phone]

## 2018-05-11 NOTE — Telephone Encounter (Signed)
Post ED Visit - Positive Culture Follow-up: Unsuccessful Patient Follow-up  Culture assessed and recommendations reviewed by:  []  Enzo BiNathan Batchelder, Pharm.D. []  Celedonio MiyamotoJeremy Frens, Pharm.D., BCPS AQ-ID []  Garvin FilaMike Maccia, Pharm.D., BCPS []  Georgina PillionElizabeth Sthilaire, Pharm.D., BCPS []  SteeltonMinh Pham, [address]Pharm.D., BCPS, AAHIVP []  Estella HuskMichelle Turner, Pharm.D., BCPS, AAHIVP []  Sherlynn CarbonAustin Lucas, PharmD []  Pollyann SamplesAndy Johnston, PharmD, BCPS Rollen Soxachel Rhumbarger, Pharm D  Positive urine culture  [x]  Patient discharged without antimicrobial prescription and treatment is now indicated:  Cephalexin 500mg  PO BID x 7 days []  Organism is resistant to prescribed ED discharge antimicrobial []  Patient with positive blood cultures   Unable to contact patient after 3 attempts, letter will be sent to address on file  Lysle PearlRobertson, Shayana Hornstein Talley 05/11/2018, 10:01 AM

## 2018-05-18 ENCOUNTER — Telehealth: Payer: Self-pay

## 2018-05-18 NOTE — Telephone Encounter (Signed)
Pt returned call for symptom check for UC done ED 05/08/18  No problems. No diarrhea, No  fevers. No urinary problems. Feels fine. No abx ordered at this time per  PA-C Sharen Hecklaudia Gibbons

## 2021-02-02 ENCOUNTER — Emergency Department (HOSPITAL_COMMUNITY): Payer: Self-pay

## 2021-02-02 ENCOUNTER — Other Ambulatory Visit: Payer: Self-pay

## 2021-02-02 ENCOUNTER — Encounter (HOSPITAL_COMMUNITY): Payer: Self-pay | Admitting: Emergency Medicine

## 2021-02-02 ENCOUNTER — Emergency Department (HOSPITAL_COMMUNITY)
Admission: EM | Admit: 2021-02-02 | Discharge: 2021-02-03 | Disposition: A | Discharge status: Home / Self Care | Payer: Self-pay | Attending: Emergency Medicine | Admitting: Emergency Medicine

## 2021-02-02 DIAGNOSIS — R61 Generalized hyperhidrosis: Secondary | ICD-10-CM | POA: Insufficient documentation

## 2021-02-02 DIAGNOSIS — Z20822 Contact with and (suspected) exposure to covid-19: Secondary | ICD-10-CM | POA: Insufficient documentation

## 2021-02-02 DIAGNOSIS — R079 Chest pain, unspecified: Secondary | ICD-10-CM | POA: Insufficient documentation

## 2021-02-02 DIAGNOSIS — M542 Cervicalgia: Secondary | ICD-10-CM | POA: Insufficient documentation

## 2021-02-02 DIAGNOSIS — Z87891 Personal history of nicotine dependence: Secondary | ICD-10-CM | POA: Insufficient documentation

## 2021-02-02 LAB — CBC WITH DIFFERENTIAL/PLATELET
Abs Immature Granulocytes: 0.03 10*3/uL (ref 0.00–0.07)
Basophils Absolute: 0 10*3/uL (ref 0.0–0.1)
Basophils Relative: 0 %
Eosinophils Absolute: 0.2 10*3/uL (ref 0.0–0.5)
Eosinophils Relative: 2 %
HCT: 39.1 % (ref 36.0–46.0)
Hemoglobin: 12.5 g/dL (ref 12.0–15.0)
Immature Granulocytes: 0 %
Lymphocytes Relative: 31 %
Lymphs Abs: 3.6 10*3/uL (ref 0.7–4.0)
MCH: 26 pg (ref 26.0–34.0)
MCHC: 32 g/dL (ref 30.0–36.0)
MCV: 81.3 fL (ref 80.0–100.0)
Monocytes Absolute: 0.7 10*3/uL (ref 0.1–1.0)
Monocytes Relative: 6 %
Neutro Abs: 7.3 10*3/uL (ref 1.7–7.7)
Neutrophils Relative %: 61 %
Platelets: 311 10*3/uL (ref 150–400)
RBC: 4.81 MIL/uL (ref 3.87–5.11)
RDW: 14.9 % (ref 11.5–15.5)
WBC: 11.9 10*3/uL — ABNORMAL HIGH (ref 4.0–10.5)
nRBC: 0 % (ref 0.0–0.2)

## 2021-02-02 LAB — BASIC METABOLIC PANEL
Anion gap: 6 (ref 5–15)
BUN: 13 mg/dL (ref 6–20)
CO2: 26 mmol/L (ref 22–32)
Calcium: 9.1 mg/dL (ref 8.9–10.3)
Chloride: 106 mmol/L (ref 98–111)
Creatinine, Ser: 0.73 mg/dL (ref 0.44–1.00)
GFR, Estimated: >60 mL/min (ref >60)
Glucose, Bld: 102 mg/dL — ABNORMAL HIGH (ref 70–99)
Potassium: 3.8 mmol/L (ref 3.5–5.1)
Sodium: 138 mmol/L (ref 135–145)

## 2021-02-02 LAB — TROPONIN I (HIGH SENSITIVITY): Troponin I (High Sensitivity): 8 ng/L (ref <18)

## 2021-02-02 MED ORDER — SODIUM CHLORIDE 0.9 % IV SOLN
INTRAVENOUS | Status: DC
  Administered 2021-02-02: 1000 mL via INTRAVENOUS

## 2021-02-02 NOTE — ED Notes (Signed)
Patient transported to CT scan . 

## 2021-02-02 NOTE — ED Triage Notes (Signed)
[place] EMS from church felt lightheaded with mild headache while at church this evening , CBG= 89 , feels " tired" with fatigue , no fever or chills , alert and oriented , respirations unlabored .

## 2021-02-02 NOTE — ED Provider Notes (Signed)
[facility] EMERGENCY DEPARTMENT Provider Note   CSN: [id number] Arrival date & time: 02/02/21  2117     History Chief Complaint  Patient presents with  . Near Syncope    Amanda Reynolds is a 60 y.o. female.  60 year old female who presents with neck pain that radiated to her chest.  This occurred while she was caring a 32 pack of water.  Pain started in her neck and was associate with diaphoresis.  Was exertional and better with rest.  No associated dyspnea.  She currently feels back to her symptoms.  Denies any radicular symptoms from her neck.  No new weakness.  Had similar episode several days ago when she was on a hike where she had neck discomfort associated with diaphoresis that improved with rest.  Has history of chest pain in the past and states that 8 years ago she had negative treadmill stress test.        Past Medical History:  Diagnosis Date  . 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    There are no problems to display for this patient.   Past Surgical History:  Procedure Laterality Date  . CESAREAN SECTION       OB History   No obstetric history on file.     Family History  Problem Relation Age of Onset  . Diabetes Mother   . Hypertension Mother     Social History   Tobacco Use  . Smoking status: Former Smoker    Types: Cigarettes    Quit date: 07/01/2006    Years since quitting: 14.6  Substance Use Topics  . Alcohol use: Yes    Alcohol/week: 1.0 standard drink    Types: 1 Standard drinks or equivalent per week    Comment: occ  . Drug use: No    Home Medications Prior to Admission medications   Medication Sig Start Date End Date Taking? Authorizing Provider  meclizine (ANTIVERT) 25 MG tablet Take 1 tablet (25 mg total) by mouth 3 (three) times daily as needed for dizziness. 05/08/18   Eber Hong, MD    Allergies    Codeine  Review of Systems   Review of Systems  All other systems reviewed and are  negative.   Physical Exam Updated Vital Signs BP (!) 141/87 (BP Location: Left Arm)   Pulse 92   Temp 98.4 F (36.9 C) (Oral)   Resp 18   Ht 1.499 m (4\' 11" )   Wt 80 kg   SpO2 96%   BMI 35.62 kg/m   Physical Exam Vitals and nursing note reviewed.  Constitutional:      General: She is not in acute distress.    Appearance: Normal appearance. She is well-developed. She is not toxic-appearing.  HENT:     Head: Normocephalic and atraumatic.  Eyes:     General: Lids are normal.     Conjunctiva/sclera: Conjunctivae normal.     Pupils: Pupils are equal, round, and reactive to light.  Neck:     Thyroid: No thyroid mass.     Trachea: No tracheal deviation.  Cardiovascular:     Rate and Rhythm: Normal rate and regular rhythm.     Heart sounds: Normal heart sounds. No murmur heard. No gallop.   Pulmonary:     Effort: Pulmonary effort is normal. No respiratory distress.     Breath sounds: Normal breath sounds. No stridor. No decreased breath sounds, wheezing, rhonchi or rales.  Abdominal:     General:  Bowel sounds are normal. There is no distension.     Palpations: Abdomen is soft.     Tenderness: There is no abdominal tenderness. There is no rebound.  Musculoskeletal:        General: No tenderness. Normal range of motion.     Cervical back: Normal range of motion and neck supple.  Skin:    General: Skin is warm and dry.     Findings: No abrasion or rash.  Neurological:     General: No focal deficit present.     Mental Status: She is alert and oriented to person, place, and time.     GCS: GCS eye subscore is 4. GCS verbal subscore is 5. GCS motor subscore is 6.     Cranial Nerves: No cranial nerve deficit.     Sensory: No sensory deficit.     Gait: Gait is intact.  Psychiatric:        Attention and Perception: Attention normal.        Speech: Speech normal.        Behavior: Behavior normal.     ED Results / Procedures / Treatments   Labs (all labs ordered are listed,  but only abnormal results are displayed) Labs Reviewed - No data to display  EKG EKG Interpretation  Date/Time:  Tuesday February 02 2021 22:34:23 EDT Ventricular Rate:  81 PR Interval:  168 QRS Duration: 70 QT Interval:  386 QTC Calculation: 448 R Axis:   45 Text Interpretation: Normal sinus rhythm Anterior infarct , age undetermined Abnormal ECG No significant change since last tracing Confirmed by Lorre Nick ([id number]) on 02/02/2021 10:37:31 PM   Radiology No results found.  Procedures Procedures   Medications Ordered in ED Medications  0.9 %  sodium chloride infusion (has no administration in time range)    ED Course  I have reviewed the triage vital signs and the nursing notes.  Pertinent labs & imaging results that were available during my care of the patient were reviewed by me and considered in my medical decision making (see chart for details).    MDM Rules/Calculators/A&P                          Patient has no chest or neck discomfort at this time.  She has a heart score of 4.  EKG without acute findings.  Patient to have delta troponin and was sent to Dr. Judd Lien Final Clinical Impression(s) / ED Diagnoses Final diagnoses:  Chest pain    Rx / DC Orders ED Discharge Orders    None       Lorre Nick, MD 02/02/21 2240

## 2021-02-03 LAB — RESP PANEL BY RT-PCR (FLU A&B, COVID) ARPGX2
Influenza A by PCR: NEGATIVE
Influenza B by PCR: NEGATIVE
SARS Coronavirus 2 by RT PCR: NEGATIVE

## 2021-02-03 LAB — TROPONIN I (HIGH SENSITIVITY): Troponin I (High Sensitivity): 9 ng/L (ref <18)

## 2021-02-03 NOTE — Discharge Instructions (Addendum)
Call the cardiology office to arrange a follow-up appointment.  The contact information for the [facility] cardiology clinic on [facility] has been provided in this discharge summary for you to call and make these arrangements.  No strenuous activity until seen by cardiology.  Return to the emergency department in the meantime if you develop severe chest pain, difficulty breathing, or other new and concerning symptoms.

## 2021-02-03 NOTE — ED Provider Notes (Signed)
Care assumed from Dr. Freida Busman at shift change.  Patient presenting here with chest pain that started while carrying a container of water at a [employer] event this evening.  She also describes a similar episode while hiking on Sunday.  Patient's work-up thus far is unremarkable.  She has no EKG changes and troponin x2 is negative.  She has had a stress test in the past, however this was many years ago.  Disposition discussed with the patient who prefers to go home.  I feel as though this is reasonable and will arrange for outpatient cardiology follow-up.  Patient may benefit from a repeat stress test as an outpatient.  She understands not to perform any strenuous activity until evaluated by cardiology and is to return as needed if her symptoms worsen.   Geoffery Lyons, MD 02/03/21 [phone]

## 2022-01-10 ENCOUNTER — Other Ambulatory Visit: Payer: Self-pay

## 2022-01-10 ENCOUNTER — Encounter: Payer: Self-pay | Admitting: Family Medicine

## 2022-01-10 ENCOUNTER — Ambulatory Visit (INDEPENDENT_AMBULATORY_CARE_PROVIDER_SITE_OTHER): Payer: Managed Care, Other (non HMO) | Admitting: Family Medicine

## 2022-01-10 VITALS — BP 136/78 | HR 84 | Temp 98.0°F | Resp 16 | Ht 59.4 in | Wt 174.8 lb

## 2022-01-10 DIAGNOSIS — Z1159 Encounter for screening for other viral diseases: Secondary | ICD-10-CM

## 2022-01-10 DIAGNOSIS — Z1329 Encounter for screening for other suspected endocrine disorder: Secondary | ICD-10-CM

## 2022-01-10 DIAGNOSIS — Z13220 Encounter for screening for lipoid disorders: Secondary | ICD-10-CM

## 2022-01-10 DIAGNOSIS — Z0000 Encounter for general adult medical examination without abnormal findings: Secondary | ICD-10-CM

## 2022-01-10 DIAGNOSIS — Z23 Encounter for immunization: Secondary | ICD-10-CM | POA: Diagnosis not present

## 2022-01-10 DIAGNOSIS — Z114 Encounter for screening for human immunodeficiency virus [HIV]: Secondary | ICD-10-CM

## 2022-01-10 DIAGNOSIS — Z1231 Encounter for screening mammogram for malignant neoplasm of breast: Secondary | ICD-10-CM

## 2022-01-10 DIAGNOSIS — Z7689 Persons encountering health services in other specified circumstances: Secondary | ICD-10-CM

## 2022-01-10 DIAGNOSIS — Z130 Encounter for screening for diseases of the blood and blood-forming organs and certain disorders involving the immune mechanism: Secondary | ICD-10-CM

## 2022-01-10 NOTE — Progress Notes (Signed)
Patient is here to establish care with provider. Patient has has no new concerns today. Patient loves to exercise and is doing well. ? ?Shingles vaccine given today ?

## 2022-01-11 ENCOUNTER — Encounter: Payer: Self-pay | Admitting: Family Medicine

## 2022-01-11 NOTE — Progress Notes (Signed)
? ?New Patient Office Visit ? ?Subjective:  ?Patient ID: Amanda Reynolds, female    DOB: 01/10/1961  Age: 61 y.o. MRN: [id number] ? ?CC:  ?Chief Complaint  ?Patient presents with  ? Establish Care  ? ? ?HPI ?[name] presents for to establish care and for routine annual exam. Patient denies any acute complaints or concerns.  ? ?Past Medical History:  ?Diagnosis Date  ? Hypertension   ? related to pregnancy  ? ? ?Past Surgical History:  ?Procedure Laterality Date  ? CESAREAN SECTION    ? ? ?Family History  ?Problem Relation Age of Onset  ? Diabetes Mother   ? Hypertension Mother   ? ? ?Social History  ? ?Socioeconomic History  ? Marital status: Married  ?  Spouse name: Not on file  ? Number of children: 2  ? Years of education: Not on file  ? Highest education level: Not on file  ?Occupational History  ?  Employer: Civil engineer, contracting  ?Tobacco Use  ? Smoking status: Former  ?  Types: Cigarettes  ?  Quit date: 07/01/2006  ?  Years since quitting: 15.5  ? Smokeless tobacco: Not on file  ?Substance and Sexual Activity  ? Alcohol use: Yes  ?  Alcohol/week: 1.0 standard drink  ?  Types: 1 Standard drinks or equivalent per week  ?  Comment: occ  ? Drug use: No  ? Sexual activity: Yes  ?Other Topics Concern  ? Not on file  ?Social History Narrative  ? Not on file  ? ?Social Determinants of Health  ? ?Financial Resource Strain: Not on file  ?Food Insecurity: Not on file  ?Transportation Needs: Not on file  ?Physical Activity: Not on file  ?Stress: Not on file  ?Social Connections: Not on file  ?Intimate Partner Violence: Not on file  ? ? ?ROS ?Review of Systems  ?All other systems reviewed and are negative. ? ?Objective:  ? ?Today's Vitals: BP 136/78   Pulse 84   Temp 98 ?F (36.7 ?C) (Oral)   Resp 16   Ht 4' 11.4" (1.509 m)   Wt 174 lb 12.8 oz (79.3 kg)   SpO2 96%   BMI 34.83 kg/m?  ? ?Physical Exam ?Vitals and nursing note reviewed.  ?Constitutional:   ?   General: She is not in acute distress. ?HENT:  ?    Head: Normocephalic and atraumatic.  ?   Right Ear: Tympanic membrane, ear canal and external ear normal.  ?   Left Ear: Tympanic membrane, ear canal and external ear normal.  ?   Nose: Nose normal.  ?   Mouth/Throat:  ?   Mouth: Mucous membranes are moist.  ?   Pharynx: Oropharynx is clear.  ?Eyes:  ?   Conjunctiva/sclera: Conjunctivae normal.  ?   Pupils: Pupils are equal, round, and reactive to light.  ?Neck:  ?   Thyroid: No thyromegaly.  ?Cardiovascular:  ?   Rate and Rhythm: Normal rate and regular rhythm.  ?   Heart sounds: Normal heart sounds. No murmur heard. ?Pulmonary:  ?   Effort: Pulmonary effort is normal. No respiratory distress.  ?   Breath sounds: Normal breath sounds.  ?Abdominal:  ?   General: There is no distension.  ?   Palpations: Abdomen is soft. There is no mass.  ?   Tenderness: There is no abdominal tenderness.  ?Musculoskeletal:     ?   General: Normal range of motion.  ?   Cervical back:  Normal range of motion and neck supple.  ?Skin: ?   General: Skin is warm and dry.  ?Neurological:  ?   General: No focal deficit present.  ?   Mental Status: She is alert and oriented to person, place, and time.  ?Psychiatric:     ?   Mood and Affect: Mood normal.     ?   Behavior: Behavior normal.  ? ? ?Assessment & Plan:  ? ?1. Annual physical exam ?Routine labs ordered ?- CMP14+EGFR ? ?2. Need for shingles vaccine ? ?- Varicella-zoster vaccine IM ? ?3. Encounter for screening mammogram for malignant neoplasm of breast ? ?- MM Digital Screening; Future ? ?4. Screening for deficiency anemia ? ?- CBC with Differential ? ?5. Screening for HIV (human immunodeficiency virus) ? ? ?6. Need for hepatitis C screening test ? ?- Hepatitis C Antibody ? ?7. Screening for endocrine/metabolic/immunity disorders ? ?- Vitamin D, 25-hydroxy ?- Hemoglobin A1c ?- TSH ? ?8. Screening for lipid disorders ? ?- Lipid Panel ? ?9. Encounter to establish care ? ? ? ? ?Outpatient Encounter Medications as of 01/10/2022  ?Medication  Sig  ? meclizine (ANTIVERT) 25 MG tablet Take 1 tablet (25 mg total) by mouth 3 (three) times daily as needed for dizziness.  ? ?No facility-administered encounter medications on file as of 01/10/2022.  ? ? ?Follow-up: No follow-ups on file.  ? ?Becky Sax, MD ? ?

## 2022-02-14 ENCOUNTER — Ambulatory Visit (INDEPENDENT_AMBULATORY_CARE_PROVIDER_SITE_OTHER): Payer: Managed Care, Other (non HMO)

## 2022-02-14 DIAGNOSIS — Z23 Encounter for immunization: Secondary | ICD-10-CM | POA: Diagnosis not present

## 2022-02-14 NOTE — Progress Notes (Signed)
Patient received shingle vaccine in left deltoid. Patient tolerated it well. ?

## 2022-02-18 ENCOUNTER — Ambulatory Visit
Admission: RE | Admit: 2022-02-18 | Discharge: 2022-02-18 | Disposition: A | Discharge status: Home / Self Care | Payer: Managed Care, Other (non HMO) | Source: Ambulatory Visit | Attending: Family Medicine | Admitting: Family Medicine

## 2022-02-18 DIAGNOSIS — Z1231 Encounter for screening mammogram for malignant neoplasm of breast: Secondary | ICD-10-CM

## 2022-05-05 ENCOUNTER — Ambulatory Visit: Payer: Commercial Managed Care - HMO | Admitting: Family Medicine

## 2022-05-05 ENCOUNTER — Encounter: Payer: Self-pay | Admitting: Family Medicine

## 2022-05-05 VITALS — BP 152/81 | HR 89 | Temp 98.1°F | Resp 16 | Wt 184.4 lb

## 2022-05-05 DIAGNOSIS — R030 Elevated blood-pressure reading, without diagnosis of hypertension: Secondary | ICD-10-CM

## 2022-05-05 DIAGNOSIS — F4321 Adjustment disorder with depressed mood: Secondary | ICD-10-CM | POA: Diagnosis not present

## 2022-05-05 DIAGNOSIS — H6692 Otitis media, unspecified, left ear: Secondary | ICD-10-CM

## 2022-05-05 MED ORDER — AMOXICILLIN 875 MG PO TABS: 875.0000 mg | ORAL_TABLET | Freq: Two times a day (BID) | ORAL | 0 refills | Status: AC

## 2022-05-05 NOTE — Progress Notes (Signed)
New Patient Office Visit  Subjective    Patient ID: Amanda Reynolds, female    DOB: [birth year]  Age: 61 y.o. MRN: [id number]  CC:  Chief Complaint  Patient presents with   Ear Pain    HPI ARABEL BARCENAS presents with complaint of left ear pain. Patient denies fever/chills or viral sx. Patient denies known exposures or contacts. Patient also reports increased social stressors with her husbands recent loss of job.    Outpatient Encounter Medications as of 05/05/2022  Medication Sig   amoxicillin (AMOXIL) 875 MG tablet Take 1 tablet (875 mg total) by mouth 2 (two) times daily for 10 days.   meclizine (ANTIVERT) 25 MG tablet Take 1 tablet (25 mg total) by mouth 3 (three) times daily as needed for dizziness.   No facility-administered encounter medications on file as of 05/05/2022.    Past Medical History:  Diagnosis Date   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    Past Surgical History:  Procedure Laterality Date   CESAREAN SECTION      Family History  Problem Relation Age of Onset   Diabetes Mother    Hypertension Mother     Social History   Socioeconomic History   Marital status: Married    Spouse name: Not on file   Number of children: 2   Years of education: Not on file   Highest education level: Not on file  Occupational History    Employer: [employer]  Tobacco Use   Smoking status: Former    Types: Cigarettes    Quit date: 07/01/2006    Years since quitting: 15.8   Smokeless tobacco: Not on file  Substance and Sexual Activity   Alcohol use: Yes    Alcohol/week: 1.0 standard drink of alcohol    Types: 1 Standard drinks or equivalent per week    Comment: occ   Drug use: No   Sexual activity: Yes  Other Topics Concern   Not on file  Social History Narrative   Not on file   Social Determinants of Health   Financial Resource Strain: Not on file  Food Insecurity: Not on file  Transportation Needs: Not on file  Physical Activity: Not on file   Stress: Not on file  Social Connections: Not on file  Intimate Partner Violence: Not on file    Review of Systems  Constitutional:  Negative for chills and fever.  HENT:  Positive for ear pain. Negative for ear discharge and sinus pain.   Psychiatric/Behavioral:  Positive for depression. Negative for suicidal ideas. The patient is nervous/anxious. The patient does not have insomnia.   All other systems reviewed and are negative.       Objective    BP (!) 152/81   Pulse 89   Temp 98.1 F (36.7 C) (Oral)   Resp 16   Wt 184 lb 6.4 oz (83.6 kg)   SpO2 94%   BMI 36.74 kg/m   Physical Exam Vitals and nursing note reviewed.  Constitutional:      General: She is not in acute distress. HENT:     Head: Normocephalic and atraumatic.     Right Ear: Tympanic membrane normal.     Left Ear: Tenderness present. No drainage. Tympanic membrane is bulging.     Mouth/Throat:     Mouth: Mucous membranes are moist.     Pharynx: Oropharynx is clear.  Eyes:     Conjunctiva/sclera: Conjunctivae normal.  Cardiovascular:  Rate and Rhythm: Normal rate and regular rhythm.  Pulmonary:     Effort: Pulmonary effort is normal.     Breath sounds: Normal breath sounds.  Abdominal:     Tenderness: There is no abdominal tenderness.  Neurological:     General: No focal deficit present.     Mental Status: She is alert and oriented to person, place, and time.         Assessment & Plan:   1. Otitis of left ear Amox prescribed. Tylenol/nsaids prn  2. Elevated blood-pressure reading without diagnosis of hypertension Will monitor  3. Situational depression Patient defers any agent at this time    No follow-ups on file.   Tommie Raymond, MD

## 2022-05-05 NOTE — Progress Notes (Signed)
Patient is here for ear ache (left) Patient said her glands are swollen and neck also x 3 days

## 2023-01-11 ENCOUNTER — Other Ambulatory Visit: Payer: Self-pay | Admitting: Emergency Medicine

## 2023-01-19 ENCOUNTER — Encounter: Payer: Commercial Managed Care - HMO | Admitting: Family Medicine

## 2023-01-26 ENCOUNTER — Other Ambulatory Visit: Payer: Self-pay | Admitting: Family Medicine

## 2023-01-26 ENCOUNTER — Ambulatory Visit (INDEPENDENT_AMBULATORY_CARE_PROVIDER_SITE_OTHER): Payer: Commercial Managed Care - HMO | Admitting: Family Medicine

## 2023-01-26 ENCOUNTER — Encounter: Payer: Self-pay | Admitting: Family Medicine

## 2023-01-26 ENCOUNTER — Other Ambulatory Visit (HOSPITAL_COMMUNITY)
Admission: RE | Admit: 2023-01-26 | Discharge: 2023-01-26 | Disposition: A | Discharge status: Home / Self Care | Payer: Commercial Managed Care - HMO | Source: Ambulatory Visit | Attending: Family Medicine | Admitting: Family Medicine

## 2023-01-26 VITALS — BP 145/79 | HR 100 | Temp 98.1°F | Resp 16 | Ht 59.0 in | Wt 181.4 lb

## 2023-01-26 DIAGNOSIS — Z124 Encounter for screening for malignant neoplasm of cervix: Secondary | ICD-10-CM

## 2023-01-26 DIAGNOSIS — Z13228 Encounter for screening for other metabolic disorders: Secondary | ICD-10-CM

## 2023-01-26 DIAGNOSIS — Z130 Encounter for screening for diseases of the blood and blood-forming organs and certain disorders involving the immune mechanism: Secondary | ICD-10-CM | POA: Diagnosis not present

## 2023-01-26 DIAGNOSIS — Z1211 Encounter for screening for malignant neoplasm of colon: Secondary | ICD-10-CM

## 2023-01-26 DIAGNOSIS — Z0000 Encounter for general adult medical examination without abnormal findings: Secondary | ICD-10-CM | POA: Diagnosis present

## 2023-01-26 DIAGNOSIS — Z1231 Encounter for screening mammogram for malignant neoplasm of breast: Secondary | ICD-10-CM

## 2023-01-26 DIAGNOSIS — Z1329 Encounter for screening for other suspected endocrine disorder: Secondary | ICD-10-CM

## 2023-01-26 DIAGNOSIS — Z13220 Encounter for screening for lipoid disorders: Secondary | ICD-10-CM | POA: Diagnosis not present

## 2023-01-26 MED ORDER — MECLIZINE HCL 25 MG PO TABS: 25.0000 mg | ORAL_TABLET | Freq: Three times a day (TID) | ORAL | 0 refills | Status: AC | PRN

## 2023-01-26 NOTE — Progress Notes (Signed)
Established Patient Office Visit  Subjective    Patient ID: Amanda Reynolds, female    DOB: 04-28-1961  Age: 62 y.o. MRN: [id number]  CC: No chief complaint on file.   HPI KYNSLEE BETSCH presents for routine annual exam. Patient denies acute complaints or concerns.    Outpatient Encounter Medications as of 01/26/2023  Medication Sig   ondansetron (ZOFRAN) 4 MG tablet Take 1 tablet 3 times a day by oral route as needed.   meclizine (ANTIVERT) 25 MG tablet Take 1 tablet (25 mg total) by mouth 3 (three) times daily as needed for dizziness.   No facility-administered encounter medications on file as of 01/26/2023.    Past Medical History:  Diagnosis Date   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    Past Surgical History:  Procedure Laterality Date   CESAREAN SECTION      Family History  Problem Relation Age of Onset   Diabetes Mother    Hypertension Mother     Social History   Socioeconomic History   Marital status: Married    Spouse name: Not on file   Number of children: 2   Years of education: Not on file   Highest education level: Not on file  Occupational History    Employer: [employer]  Tobacco Use   Smoking status: Former    Types: Cigarettes    Quit date: 07/01/2006    Years since quitting: 16.5   Smokeless tobacco: Not on file  Substance and Sexual Activity   Alcohol use: Yes    Alcohol/week: 1.0 standard drink of alcohol    Types: 1 Standard drinks or equivalent per week    Comment: occ   Drug use: No   Sexual activity: Yes  Other Topics Concern   Not on file  Social History Narrative   Not on file   Social Determinants of Health   Financial Resource Strain: Not on file  Food Insecurity: Not on file  Transportation Needs: Not on file  Physical Activity: Not on file  Stress: Not on file  Social Connections: Not on file  Intimate Partner Violence: Not on file    Review of Systems  All other systems reviewed and are  negative.       Objective    BP (!) 145/79   Pulse 100   Temp 98.1 F (36.7 C) (Oral)   Resp 16   Ht 4\' 11"  (1.499 m)   Wt 181 lb 6.4 oz (82.3 kg)   SpO2 97%   BMI 36.64 kg/m   Physical Exam Vitals and nursing note reviewed.  Constitutional:      General: She is not in acute distress. HENT:     Head: Normocephalic and atraumatic.     Right Ear: Tympanic membrane, ear canal and external ear normal.     Left Ear: Tympanic membrane, ear canal and external ear normal.     Nose: Nose normal.     Mouth/Throat:     Mouth: Mucous membranes are moist.     Pharynx: Oropharynx is clear.  Eyes:     Conjunctiva/sclera: Conjunctivae normal.     Pupils: Pupils are equal, round, and reactive to light.  Neck:     Thyroid: No thyromegaly.  Cardiovascular:     Rate and Rhythm: Normal rate and regular rhythm.     Heart sounds: Normal heart sounds. No murmur heard. Pulmonary:     Effort: Pulmonary effort is normal. No respiratory distress.  Breath sounds: Normal breath sounds.  Abdominal:     General: There is no distension.     Palpations: Abdomen is soft. There is no mass.     Tenderness: There is no abdominal tenderness.  Genitourinary:    General: Normal vulva.     Exam position: Supine.     Labia:        Right: No lesion.        Left: No lesion.      Vagina: Normal.     Cervix: Normal.     Uterus: Normal.      Adnexa: Right adnexa normal.     Rectum: Normal.  Musculoskeletal:        General: Normal range of motion.     Cervical back: Normal range of motion and neck supple.  Skin:    General: Skin is warm and dry.  Neurological:     General: No focal deficit present.     Mental Status: She is alert and oriented to person, place, and time.  Psychiatric:        Mood and Affect: Mood normal.        Behavior: Behavior normal.         Assessment & Plan:   1. Annual physical exam  - CMP14+EGFR - Cytology - PAP - Cervicovaginal ancillary only  2. Encounter  for screening mammogram for malignant neoplasm of breast  - MM 3D SCREENING MAMMOGRAM BILATERAL BREAST; Future  3. Screening for deficiency anemia  - CBC with Differential  4. Screening for endocrine/metabolic/immunity disorders  - Hemoglobin A1c - TSH - Vitamin D, 25-hydroxy  5. Screening for lipid disorders  - Lipid Panel  6. Screening for colon cancer  - Ambulatory referral to Gastroenterology  7. Pap smear for cervical cancer screening     No follow-ups on file.   Becky Sax, MD

## 2023-01-27 ENCOUNTER — Encounter: Payer: Self-pay | Admitting: Family Medicine

## 2023-01-27 LAB — CBC WITH DIFFERENTIAL/PLATELET
Basophils Absolute: 0 10*3/uL (ref 0.0–0.2)
Basos: 0 %
EOS (ABSOLUTE): 0.1 10*3/uL (ref 0.0–0.4)
Eos: 1 %
Hematocrit: 43.6 % (ref 34.0–46.6)
Hemoglobin: 13.8 g/dL (ref 11.1–15.9)
Immature Grans (Abs): 0 10*3/uL (ref 0.0–0.1)
Immature Granulocytes: 0 %
Lymphocytes Absolute: 2.7 10*3/uL (ref 0.7–3.1)
Lymphs: 20 %
MCH: 25.3 pg — ABNORMAL LOW (ref 26.6–33.0)
MCHC: 31.7 g/dL (ref 31.5–35.7)
MCV: 80 fL (ref 79–97)
Monocytes Absolute: 0.9 10*3/uL (ref 0.1–0.9)
Monocytes: 7 %
Neutrophils Absolute: 9.3 10*3/uL — ABNORMAL HIGH (ref 1.4–7.0)
Neutrophils: 72 %
Platelets: 396 10*3/uL (ref 150–450)
RBC: 5.46 x10E6/uL — ABNORMAL HIGH (ref 3.77–5.28)
RDW: 14.8 % (ref 11.7–15.4)
WBC: 13.1 10*3/uL — ABNORMAL HIGH (ref 3.4–10.8)

## 2023-01-27 LAB — CERVICOVAGINAL ANCILLARY ONLY
Bacterial Vaginitis (gardnerella): NEGATIVE
Candida Glabrata: NEGATIVE
Candida Vaginitis: NEGATIVE
Chlamydia: NEGATIVE
Comment: NEGATIVE
Comment: NEGATIVE
Comment: NEGATIVE
Comment: NEGATIVE
Comment: NEGATIVE
Comment: NORMAL
Neisseria Gonorrhea: NEGATIVE
Trichomonas: NEGATIVE

## 2023-01-27 LAB — CMP14+EGFR
ALT: 17 IU/L (ref 0–32)
AST: 19 IU/L (ref 0–40)
Albumin/Globulin Ratio: 1.3 (ref 1.2–2.2)
Albumin: 4.4 g/dL (ref 3.8–4.9)
Alkaline Phosphatase: 108 IU/L (ref 44–121)
BUN/Creatinine Ratio: 11 — ABNORMAL LOW (ref 12–28)
BUN: 9 mg/dL (ref 8–27)
Bilirubin Total: 0.3 mg/dL (ref 0.0–1.2)
CO2: 23 mmol/L (ref 20–29)
Calcium: 10.1 mg/dL (ref 8.7–10.3)
Chloride: 100 mmol/L (ref 96–106)
Creatinine, Ser: 0.83 mg/dL (ref 0.57–1.00)
Globulin, Total: 3.4 g/dL (ref 1.5–4.5)
Glucose: 96 mg/dL (ref 70–99)
Potassium: 4.7 mmol/L (ref 3.5–5.2)
Sodium: 140 mmol/L (ref 134–144)
Total Protein: 7.8 g/dL (ref 6.0–8.5)
eGFR: 81 mL/min/{1.73_m2} (ref >59)

## 2023-01-27 LAB — LIPID PANEL W/O CHOL/HDL RATIO
Cholesterol, Total: 250 mg/dL — ABNORMAL HIGH (ref 100–199)
HDL: 49 mg/dL (ref >39)
LDL Chol Calc (NIH): 173 mg/dL — ABNORMAL HIGH (ref 0–99)
Triglycerides: 154 mg/dL — ABNORMAL HIGH (ref 0–149)
VLDL Cholesterol Cal: 28 mg/dL (ref 5–40)

## 2023-01-27 LAB — SPECIMEN STATUS REPORT

## 2023-01-27 LAB — HGB A1C W/O EAG: Hgb A1c MFr Bld: 5.7 % — ABNORMAL HIGH (ref 4.8–5.6)

## 2023-01-27 LAB — TSH: TSH: 5.35 u[IU]/mL — ABNORMAL HIGH (ref 0.450–4.500)

## 2023-01-30 LAB — CYTOLOGY - PAP
Adequacy: ABSENT
Diagnosis: NEGATIVE

## 2023-02-07 ENCOUNTER — Encounter: Payer: Self-pay | Admitting: Gastroenterology

## 2023-03-02 ENCOUNTER — Ambulatory Visit: Payer: Commercial Managed Care - HMO | Admitting: *Deleted

## 2023-03-02 ENCOUNTER — Encounter: Payer: Self-pay | Admitting: Gastroenterology

## 2023-03-02 VITALS — Ht 59.0 in | Wt 180.0 lb

## 2023-03-02 DIAGNOSIS — Z1211 Encounter for screening for malignant neoplasm of colon: Secondary | ICD-10-CM

## 2023-03-02 MED ORDER — NA SULFATE-K SULFATE-MG SULF 17.5-3.13-1.6 GM/177ML PO SOLN
1.0000 | Freq: Once | ORAL | 0 refills | Status: AC
Start: 2023-03-02 — End: 2023-03-02

## 2023-03-02 NOTE — Progress Notes (Signed)
Pt's name and DOB verified at the beginning of the pre-visit.  Pt denies any difficulty with ambulating,sitting, laying down or rolling side to side Gave both [facility] main # and MD on call # prior to instructions.  No egg or soy allergy known to patient  No issues known to pt with past sedation with any surgeries or procedures Patient denies ever being intubated Pt has no issues moving head neck or swallowing No FH of Malignant Hyperthermia Pt is not on diet pills Pt is not on home 02  Pt is not on blood thinners  Pt denies issues with constipation  Pt is not on dialysis Pt denies any upcoming cardiac testing Pt encouraged to use to use Singlecare or Goodrx to reduce cost  Patient's chart reviewed by Amanda Reynolds CNRA prior to pre-visit and patient appropriate for the [facility].  Pre-visit completed and red dot placed by patient's name on their procedure day (on provider's schedule).  . Visit by phone Pt states weight is 180lb Instructed pt why it is important to and  to call if they have any changes in health or new medications. Directed them to the # given and on instructions.   Pt states they will.  Instructions reviewed with pt and pt states understanding. Instructed to review again prior to procedure. Pt states they will.  Instructions sent by mail with coupon and by my chart

## 2023-03-16 ENCOUNTER — Ambulatory Visit
Admission: RE | Admit: 2023-03-16 | Discharge: 2023-03-16 | Disposition: A | Discharge status: Home / Self Care | Payer: Commercial Managed Care - HMO | Source: Ambulatory Visit | Attending: Family Medicine | Admitting: Family Medicine

## 2023-03-16 DIAGNOSIS — Z1231 Encounter for screening mammogram for malignant neoplasm of breast: Secondary | ICD-10-CM

## 2023-03-24 ENCOUNTER — Encounter: Payer: Self-pay | Admitting: Gastroenterology

## 2023-03-24 ENCOUNTER — Ambulatory Visit (AMBULATORY_SURGERY_CENTER): Payer: Commercial Managed Care - HMO | Admitting: Gastroenterology

## 2023-03-24 VITALS — BP 118/47 | HR 74 | Temp 98.0°F | Resp 10 | Ht 59.0 in | Wt 180.0 lb

## 2023-03-24 DIAGNOSIS — D124 Benign neoplasm of descending colon: Secondary | ICD-10-CM | POA: Diagnosis not present

## 2023-03-24 DIAGNOSIS — Z1211 Encounter for screening for malignant neoplasm of colon: Secondary | ICD-10-CM

## 2023-03-24 DIAGNOSIS — K635 Polyp of colon: Secondary | ICD-10-CM | POA: Diagnosis not present

## 2023-03-24 MED ORDER — SODIUM CHLORIDE 0.9 % IV SOLN: 500.0000 mL | Freq: Once | INTRAVENOUS | Status: DC

## 2023-03-24 NOTE — Progress Notes (Signed)
Called to room to assist during endoscopic procedure.  Patient ID and intended procedure confirmed with present staff. Received instructions for my participation in the procedure from the performing physician.  

## 2023-03-24 NOTE — Patient Instructions (Signed)
Handout on polyps given.    YOU HAD AN ENDOSCOPIC PROCEDURE TODAY AT THE [facility]:   Refer to the procedure report that was given to you for any specific questions about what was found during the examination.  If the procedure report does not answer your questions, please call your gastroenterologist to clarify.  If you requested that your care partner not be given the details of your procedure findings, then the procedure report has been included in a sealed envelope for you to review at your convenience later.  YOU SHOULD EXPECT: Some feelings of bloating in the abdomen. Passage of more gas than usual.  Walking can help get rid of the air that was put into your GI tract during the procedure and reduce the bloating. If you had a lower endoscopy (such as a colonoscopy or flexible sigmoidoscopy) you may notice spotting of blood in your stool or on the toilet paper. If you underwent a bowel prep for your procedure, you may not have a normal bowel movement for a few days.  Please Note:  You might notice some irritation and congestion in your nose or some drainage.  This is from the oxygen used during your procedure.  There is no need for concern and it should clear up in a day or so.  SYMPTOMS TO REPORT IMMEDIATELY:  Following lower endoscopy (colonoscopy or flexible sigmoidoscopy):  Excessive amounts of blood in the stool  Significant tenderness or worsening of abdominal pains  Swelling of the abdomen that is new, acute  Fever of 100F or higher   For urgent or emergent issues, a gastroenterologist can be reached at any hour by calling [phone]. Do not use MyChart messaging for urgent concerns.    DIET:  We do recommend a small meal at first, but then you may proceed to your regular diet.  Drink plenty of fluids but you should avoid alcoholic beverages for 24 hours.  ACTIVITY:  You should plan to take it easy for the rest of today and you should NOT DRIVE or use heavy  machinery until tomorrow (because of the sedation medicines used during the test).    FOLLOW UP: Our staff will call the number listed on your records the next business day following your procedure.  We will call around 7:15- 8:00 am to check on you and address any questions or concerns that you may have regarding the information given to you following your procedure. If we do not reach you, we will leave a message.     If any biopsies were taken you will be contacted by phone or by letter within the next 1-3 weeks.  Please call us at [phone] if you have not heard about the biopsies in 3 weeks.    SIGNATURES/CONFIDENTIALITY: You and/or your care partner have signed paperwork which will be entered into your electronic medical record.  These signatures attest to the fact that that the information above on your After Visit Summary has been reviewed and is understood.  Full responsibility of the confidentiality of this discharge information lies with you and/or your care-partner.  

## 2023-03-24 NOTE — Progress Notes (Signed)
[facility] History and Physical   Primary Care Physician:  Georganna Skeans, MD   Reason for Procedure:   Colon cancer screening  Plan:    Screening colonoscopy     HPI: Amanda Reynolds is a 62 y.o. female undergoing average risk screening colonoscopy.  She has no family history of colon cancer and no chronic GI symptoms.  She had a colonoscopy 10 years ago which she believes was normal.   Past Medical History:  Diagnosis Date   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    Past Surgical History:  Procedure Laterality Date   CESAREAN SECTION      Prior to Admission medications   Medication Sig Start Date End Date Taking? Authorizing Provider  meclizine (ANTIVERT) 25 MG tablet Take 1 tablet (25 mg total) by mouth 3 (three) times daily as needed for dizziness. 01/26/23   Georganna Skeans, MD  ondansetron (ZOFRAN) 4 MG tablet Take 1 tablet 3 times a day by oral route as needed. 05/23/18   [provider]    Current Outpatient Medications  Medication Sig Dispense Refill   meclizine (ANTIVERT) 25 MG tablet Take 1 tablet (25 mg total) by mouth 3 (three) times daily as needed for dizziness. 30 tablet 0   ondansetron (ZOFRAN) 4 MG tablet Take 1 tablet 3 times a day by oral route as needed.     Current Facility-Administered Medications  Medication Dose Route Frequency Provider Last Rate Last Admin   0.9 %  sodium chloride infusion  500 mL Intravenous Once Jenel Lucks, MD        Allergies as of 03/24/2023 - Review Complete 03/24/2023  Allergen Reaction Noted   Codeine Other (See Comments) 12/30/2011    Family History  Problem Relation Age of Onset   Diabetes Mother    Hypertension Mother    Colon polyps Neg Hx    Colon cancer Neg Hx    Esophageal cancer Neg Hx    Rectal cancer Neg Hx    Stomach cancer Neg Hx     Social History   Socioeconomic History   Marital status: Married    Spouse name: Not on file   Number of children: 2   Years of  education: Not on file   Highest education level: Not on file  Occupational History    Employer: [employer]  Tobacco Use   Smoking status: Former    Types: Cigarettes    Quit date: 07/01/2006    Years since quitting: 16.7   Smokeless tobacco: Never  Vaping Use   Vaping Use: Never used  Substance and Sexual Activity   Alcohol use: Yes    Alcohol/week: 1.0 standard drink of alcohol    Types: 1 Standard drinks or equivalent per week    Comment: occ   Drug use: No   Sexual activity: Yes  Other Topics Concern   Not on file  Social History Narrative   Not on file   Social Determinants of Health   Financial Resource Strain: Not on file  Food Insecurity: Not on file  Transportation Needs: Not on file  Physical Activity: Not on file  Stress: Not on file  Social Connections: Not on file  Intimate Partner Violence: Not on file    Review of Systems:  All other review of systems negative except as mentioned in the HPI.  Physical Exam: Vital signs BP 124/76   Pulse 78   Temp 98 F (36.7 C)   Ht 4'  11" (1.499 m)   Wt 180 lb (81.6 kg)   SpO2 99%   BMI 36.36 kg/m   General:   Alert,  Well-developed, well-nourished, pleasant and cooperative in NAD Airway:  Mallampati 3 Lungs:  Clear throughout to auscultation.   Heart:  Regular rate and rhythm; no murmurs, clicks, rubs,  or gallops. Abdomen:  Soft, nontender and nondistended. Normal bowel sounds.   Neuro/Psych:  Normal mood and affect. A and O x 3   Emillio Ngo E. Tomasa Rand, MD [facility]

## 2023-03-24 NOTE — Op Note (Signed)
[facility] Patient Name: Amanda Reynolds Procedure Date: 03/24/2023 7:49 AM MRN: [id number] Endoscopist: Lorin Picket E. Tomasa Rand , MD, [id number] Age: 62 Referring MD:  Date of Birth: 06-13-1961 Gender: Female Account #: [id number] Procedure:                Colonoscopy Indications:              Screening for colorectal malignant neoplasm (last                            colonoscopy was 10 years ago) Medicines:                Monitored Anesthesia Care Procedure:                Pre-Anesthesia Assessment:                           - Prior to the procedure, a History and Physical                            was performed, and patient medications and                            allergies were reviewed. The patient's tolerance of                            previous anesthesia was also reviewed. The risks                            and benefits of the procedure and the sedation                            options and risks were discussed with the patient.                            All questions were answered, and informed consent                            was obtained. Prior Anticoagulants: The patient has                            taken no anticoagulant or antiplatelet agents. ASA                            Grade Assessment: II - A patient with mild systemic                            disease. After reviewing the risks and benefits,                            the patient was deemed in satisfactory condition to                            undergo the procedure.  After obtaining informed consent, the colonoscope                            was passed under direct vision. Throughout the                            procedure, the patient's blood pressure, pulse, and                            oxygen saturations were monitored continuously. The                            Olympus CF-HQ190L [id number]) Colonoscope was                            introduced through the anus  and advanced to the the                            cecum, identified by appendiceal orifice and                            ileocecal valve. The colonoscopy was performed                            without difficulty. The patient tolerated the                            procedure well. The quality of the bowel                            preparation was good. The ileocecal valve,                            appendiceal orifice, and rectum were photographed.                            The bowel preparation used was SUPREP via split                            dose instruction. Scope In: 7:54:36 AM Scope Out: 8:04:48 AM Scope Withdrawal Time: 0 hours 7 minutes 8 seconds  Total Procedure Duration: 0 hours 10 minutes 12 seconds  Findings:                 Skin tags were found on perianal exam.                           The digital rectal exam was normal. Pertinent                            negatives include normal sphincter tone and no                            palpable rectal lesions.  A 4 mm polyp was found in the descending colon. The                            polyp was flat. The polyp was removed with a cold                            snare. Resection and retrieval were complete.                            Estimated blood loss was minimal.                           The exam was otherwise normal throughout the                            examined colon.                           A single small angiodysplastic lesion without                            bleeding was found in the rectum.                           Non-bleeding internal hemorrhoids were found during                            retroflexion. The hemorrhoids were Grade I                            (internal hemorrhoids that do not prolapse).                           No additional abnormalities were found on                            retroflexion. Complications:            No immediate  complications. Estimated Blood Loss:     Estimated blood loss was minimal. Impression:               - Perianal skin tags found on perianal exam.                           - One 4 mm polyp in the descending colon, removed                            with a cold snare. Resected and retrieved.                           - A single non-bleeding colonic angiodysplastic                            lesion.                           -  Non-bleeding internal hemorrhoids. Recommendation:           - Patient has a contact number available for                            emergencies. The signs and symptoms of potential                            delayed complications were discussed with the                            patient. Return to normal activities tomorrow.                            Written discharge instructions were provided to the                            patient.                           - Resume previous diet.                           - Continue present medications.                           - Await pathology results.                           - Repeat colonoscopy (date not yet determined) for                            surveillance based on pathology results. Cathrine Krizan E. Tomasa Rand, MD 03/24/2023 8:11:58 AM This report has been signed electronically.

## 2023-03-24 NOTE — Progress Notes (Signed)
Report to PACU, RN, vss, BBS= Clear.  

## 2023-03-24 NOTE — Progress Notes (Signed)
Pt's states no medical or surgical changes since previsit or office visit. 

## 2023-03-28 ENCOUNTER — Telehealth: Payer: Self-pay

## 2023-03-28 NOTE — Telephone Encounter (Signed)
  Follow up Call-     03/24/2023    7:11 AM  Call back number  Post procedure Call Back phone  # [phone]  Permission to leave phone message Yes     Patient questions:  Do you have a fever, pain , or abdominal swelling? No. Pain Score  0 *  Have you tolerated food without any problems? Yes.    Have you been able to return to your normal activities? Yes.    Do you have any questions about your discharge instructions: Diet   No. Medications  No. Follow up visit  No.  Do you have questions or concerns about your Care? No.  Actions: * If pain score is 4 or above: No action needed, pain <4.

## 2023-04-04 NOTE — Progress Notes (Signed)
Amanda Reynolds,  Good news: the polyp that I removed during your recent examination was NOT precancerous.  You should continue to follow current colorectal cancer screening guidelines with a repeat colonoscopy in 10 years.    If you develop any new rectal bleeding, abdominal pain or significant bowel habit changes, please contact me before then.

## 2023-08-03 IMAGING — MG MM DIGITAL SCREENING BILAT W/ TOMO AND CAD
8 series · 8 of 24 positions shown · non-contrast
Comparison: Previous exam(s).

CLINICAL DATA: Screening.

EXAM:
DIGITAL SCREENING BILATERAL MAMMOGRAM WITH TOMOSYNTHESIS AND CAD
TECHNIQUE: Bilateral screening digital craniocaudal and mediolateral oblique
mammograms were obtained. Bilateral screening digital breast
tomosynthesis was performed. The images were evaluated with
computer-aided detection.

[L MLO synth-2D]
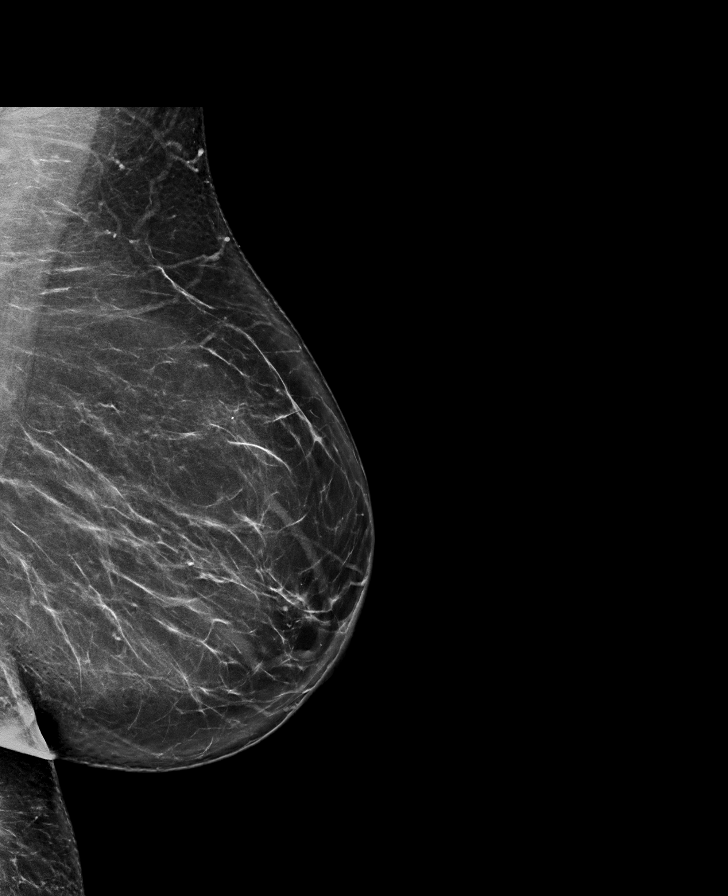

[R CC synth-2D]
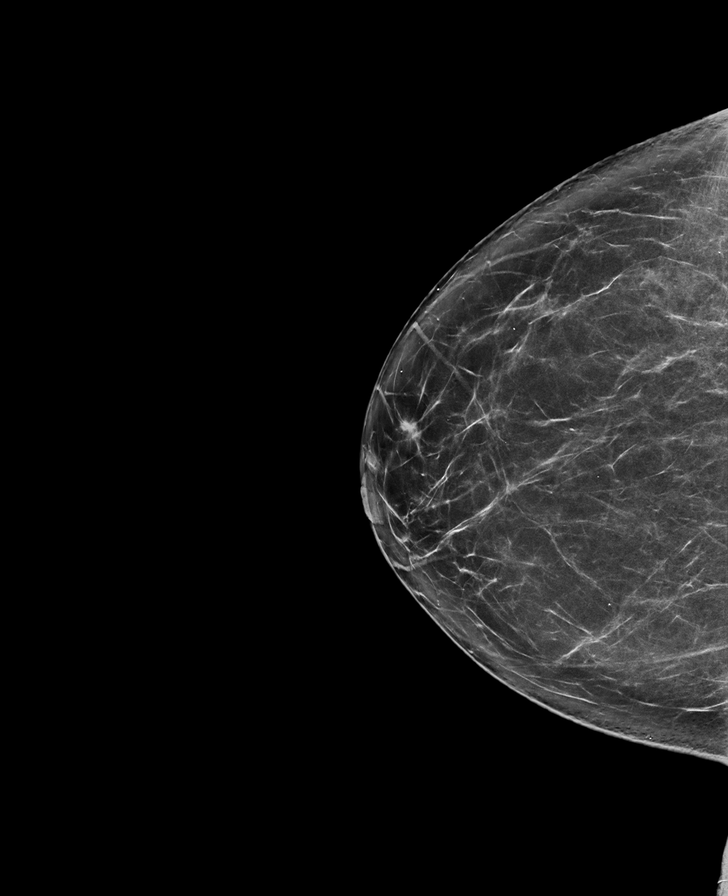

[L CC synth-2D]
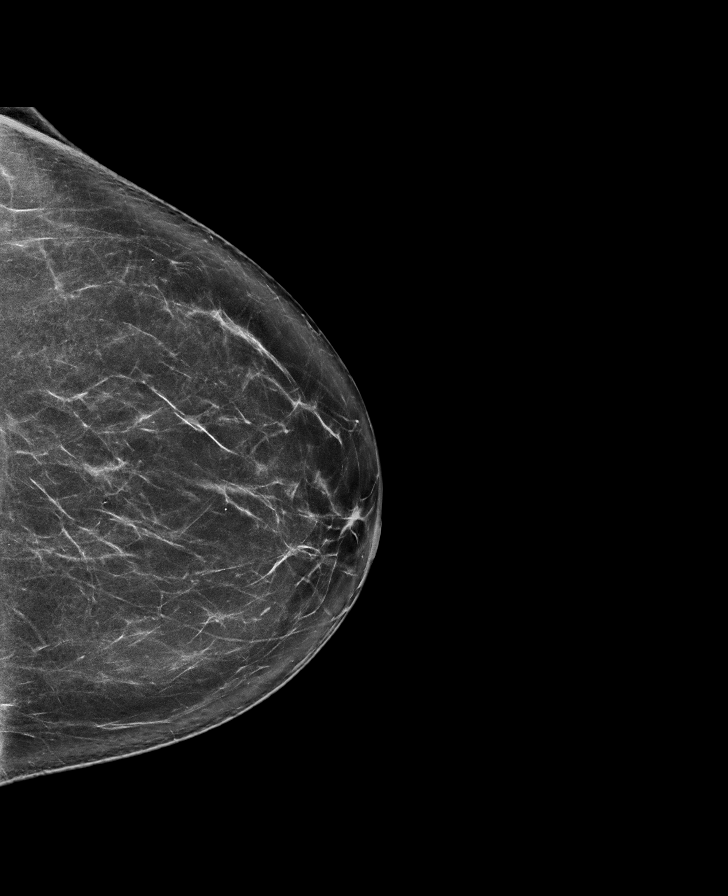

[R MLO synth-2D]
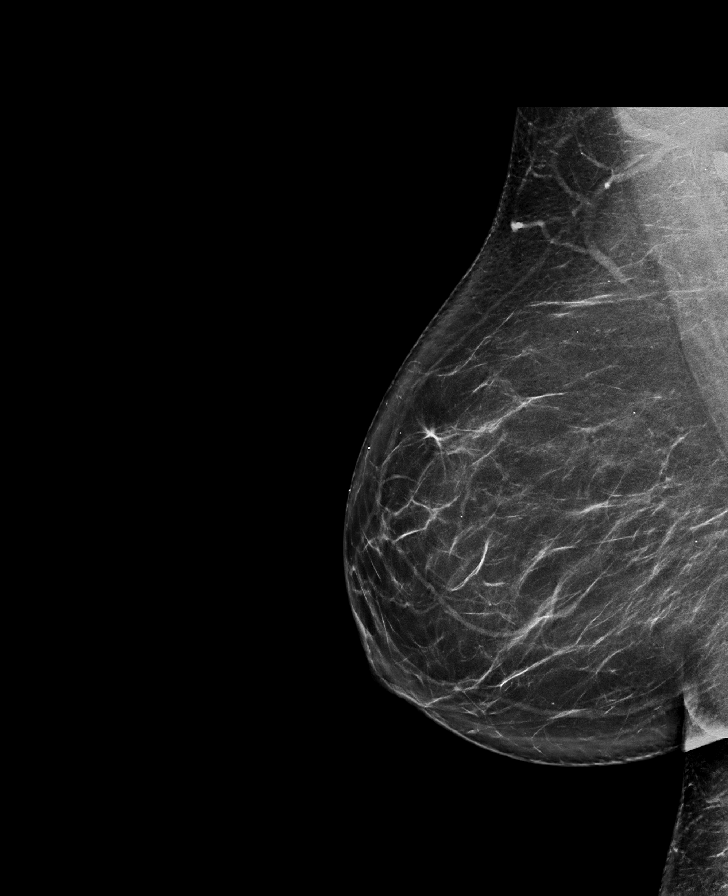

[L CC tomo · tomo slice 41/82.0]
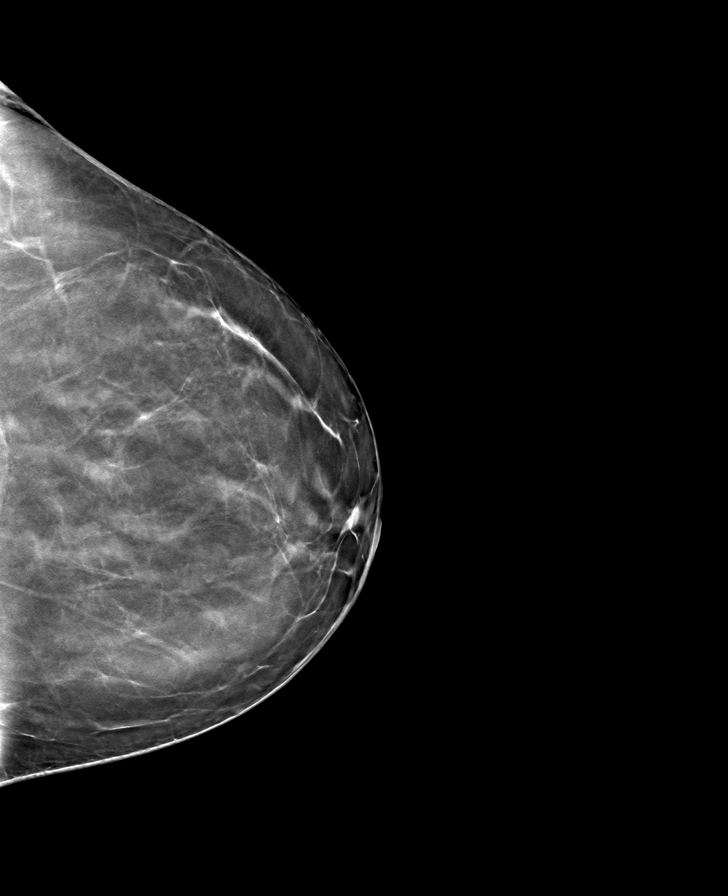

[R MLO tomo · tomo slice 43/86.0]
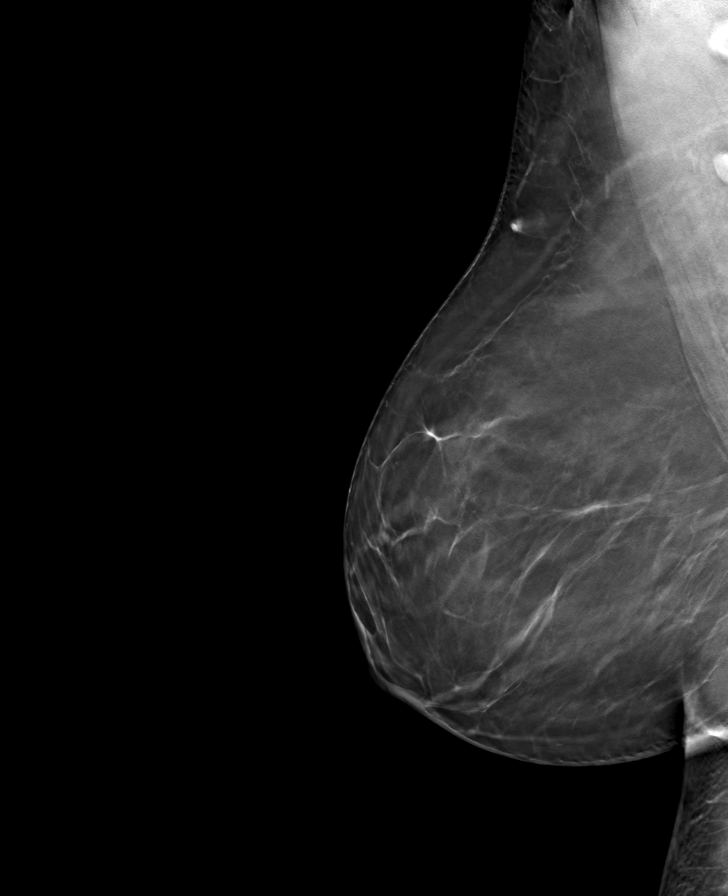

[R CC tomo · tomo slice 41/80.0]
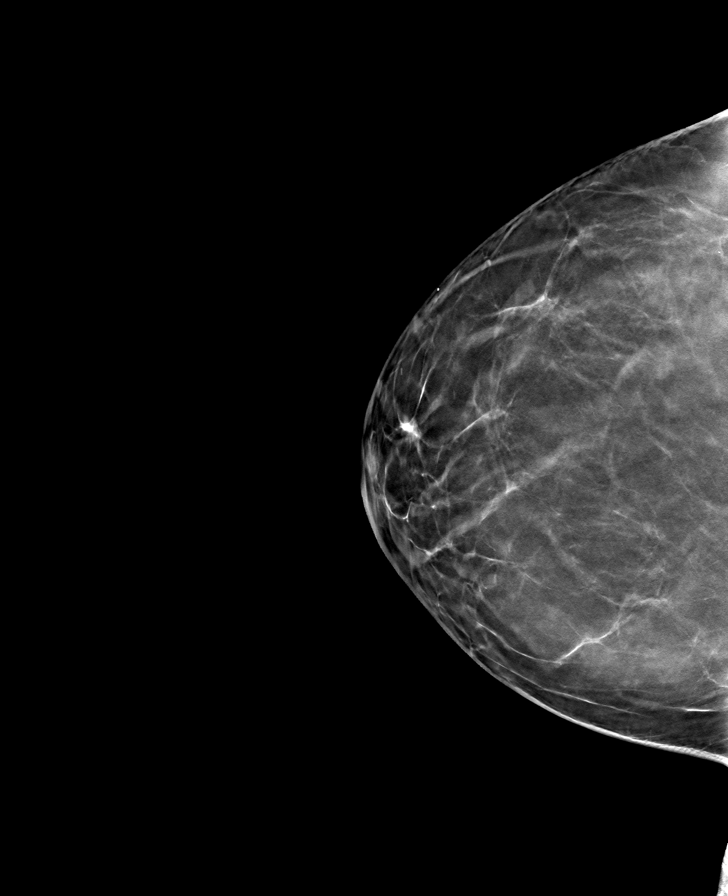

[L MLO tomo · tomo slice 43/85.0]
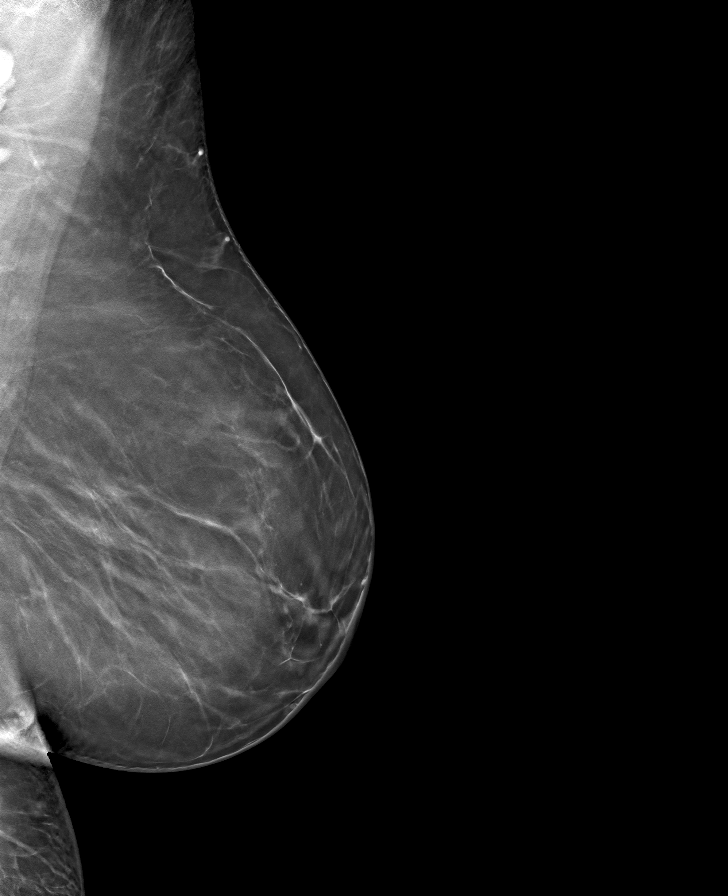

[8 of 24 positions shown; findings below may reference images not displayed]

ACR Breast Density Category b: There are scattered areas of
fibroglandular density.
FINDINGS: There are no findings suspicious for malignancy.
IMPRESSION: No mammographic evidence of malignancy. A result letter of this
screening mammogram will be mailed directly to the patient.

RECOMMENDATION:
Screening mammogram in one year. (Code:51-O-LD2)

BI-RADS CATEGORY  1: Negative.

## 2024-07-17 ENCOUNTER — Ambulatory Visit
Admission: RE | Admit: 2024-07-17 | Discharge: 2024-07-17 | Disposition: A | Discharge status: Home / Self Care | Source: Ambulatory Visit | Attending: Family Medicine | Admitting: Family Medicine

## 2024-07-17 ENCOUNTER — Other Ambulatory Visit: Payer: Self-pay

## 2024-07-17 VITALS — BP 122/70 | HR 89 | Temp 98.1°F | Resp 16

## 2024-07-17 DIAGNOSIS — R300 Dysuria: Secondary | ICD-10-CM

## 2024-07-17 LAB — POCT URINE DIPSTICK
Bilirubin, UA: NEGATIVE
Glucose, UA: NEGATIVE mg/dL
Ketones, POC UA: NEGATIVE mg/dL
Nitrite, UA: NEGATIVE
POC PROTEIN,UA: NEGATIVE
Spec Grav, UA: 1.01 (ref 1.010–1.025)
Urobilinogen, UA: 0.2 U/dL
pH, UA: 6.5 (ref 5.0–8.0)

## 2024-07-17 MED ORDER — CEPHALEXIN 500 MG PO CAPS: 500.0000 mg | ORAL_CAPSULE | Freq: Two times a day (BID) | ORAL | 0 refills | Status: AC

## 2024-07-17 NOTE — ED Provider Notes (Signed)
 [facility]    ASSESSMENT & PLAN:  1. Dysuria    Begin: Meds ordered this encounter  Medications   cephALEXin  (KEFLEX ) 500 MG capsule    Sig: Take 1 capsule (500 mg total) by mouth 2 (two) times daily.    Dispense:  10 capsule    Refill:  0   No signs of pyelonephritis. Urine culture sent. Will notify patient of any significant results. Ensure proper hydration. Will follow up with her PCP or here if not showing improvement over the next 48 hours, sooner if needed.  Outlined signs and symptoms indicating need for more acute intervention. Patient verbalized understanding. After Visit Summary given.  SUBJECTIVE:  Amanda Reynolds is a 63 y.o. female who complains of urinary frequency, urgency and dysuria for the past few days. Without associated flank pain, fever, chills, vaginal discharge or bleeding. Gross hematuria: not present. No specific aggravating or alleviating factors reported. No LE edema. Normal PO intake without n/v/d. Without specific abdominal pain. Ambulatory without difficulty. OTC treatment: none.  LMP: No LMP recorded. Patient is perimenopausal.  OBJECTIVE:  Vitals:   07/17/24 1642  BP: 122/70  Pulse: 89  Resp: 16  Temp: 98.1 F (36.7 C)  TempSrc: Oral  SpO2: 98%   General appearance: alert; no distress Psychological: alert and cooperative; normal mood and affect  Labs Reviewed  POCT URINE DIPSTICK - Abnormal; Notable for the following components:      Result Value   Color, UA light yellow (*)    Clarity, UA cloudy (*)    Blood, UA small (*)    Leukocytes, UA Small (1+) (*)    All other components within normal limits    Allergies  Allergen Reactions   Codeine Other (See Comments)    Sick on her stomach    Past Medical History:  Diagnosis Date   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   Social History   Socioeconomic History   Marital status: Married    Spouse name: Not on file   Number of children: 2   Years of  education: Not on file   Highest education level: Not on file  Occupational History    Employer: [employer]  Tobacco Use   Smoking status: Former    Current packs/day: 0.00    Types: Cigarettes    Quit date: 07/01/2006    Years since quitting: 18.0   Smokeless tobacco: Never  Vaping Use   Vaping status: Never Used  Substance and Sexual Activity   Alcohol use: Yes    Alcohol/week: 1.0 standard drink of alcohol    Types: 1 Standard drinks or equivalent per week    Comment: occ   Drug use: No   Sexual activity: Yes  Other Topics Concern   Not on file  Social History Narrative   Not on file   Social Drivers of Health   Financial Resource Strain: Not on file  Food Insecurity: Not on file  Transportation Needs: Not on file  Physical Activity: Not on file  Stress: Not on file  Social Connections: Not on file  Intimate Partner Violence: Not on file   Family History  Problem Relation Age of Onset   Diabetes Mother    Hypertension Mother    Colon polyps Neg Hx    Colon cancer Neg Hx    Esophageal cancer Neg Hx    Rectal cancer Neg Hx    Stomach cancer Neg Hx  Rolinda Rogue, MD 07/17/24 [phone]

## 2024-07-17 NOTE — Discharge Instructions (Signed)
You have had labs (a urine culture) sent today. We will call you with any significant abnormalities or if there is need to begin or change treatment or pursue further follow up.  You may also review your test results online through MyChart. If you do not have a MyChart account, instructions to sign up should be on your discharge paperwork.

## 2024-07-17 NOTE — ED Triage Notes (Signed)
 It hurts to urinate and I have been feeling nauseated. - Entered by patient  Pt states she thinks she has a UTI. Sx since Monday: burning, frequency, voiding small amounts. She has seen intermittently dizzy since Monday also. Reports hx of vertigo and she has meclizine  for it. States those symptoms are not that bad. She is mostly concerned about UTI and reports she has an ache in my back

## 2025-01-10 ENCOUNTER — Encounter: Admitting: Family Medicine
